# Patient Record
Sex: Female | Born: 1965 | Race: White | Hispanic: No | Marital: Married | Smoking: Never smoker
Health system: Southern US, Community
[De-identification: ages and names within clinical notes are randomized; demographics above are authoritative.]

## PROBLEM LIST (undated history)

## (undated) DIAGNOSIS — E119 Type 2 diabetes mellitus without complications: Secondary | ICD-10-CM

## (undated) DIAGNOSIS — D649 Anemia, unspecified: Secondary | ICD-10-CM

## (undated) DIAGNOSIS — D72829 Elevated white blood cell count, unspecified: Secondary | ICD-10-CM

## (undated) DIAGNOSIS — IMO0001 Reserved for inherently not codable concepts without codable children: Secondary | ICD-10-CM

## (undated) DIAGNOSIS — M1990 Unspecified osteoarthritis, unspecified site: Secondary | ICD-10-CM

## (undated) DIAGNOSIS — H269 Unspecified cataract: Secondary | ICD-10-CM

## (undated) DIAGNOSIS — K219 Gastro-esophageal reflux disease without esophagitis: Secondary | ICD-10-CM

## (undated) DIAGNOSIS — I10 Essential (primary) hypertension: Secondary | ICD-10-CM

## (undated) DIAGNOSIS — N189 Chronic kidney disease, unspecified: Secondary | ICD-10-CM

## (undated) DIAGNOSIS — K5900 Constipation, unspecified: Secondary | ICD-10-CM

## (undated) DIAGNOSIS — S92901A Unspecified fracture of right foot, initial encounter for closed fracture: Secondary | ICD-10-CM

## (undated) DIAGNOSIS — H9190 Unspecified hearing loss, unspecified ear: Secondary | ICD-10-CM

## (undated) DIAGNOSIS — E1140 Type 2 diabetes mellitus with diabetic neuropathy, unspecified: Secondary | ICD-10-CM

## (undated) DIAGNOSIS — K5790 Diverticulosis of intestine, part unspecified, without perforation or abscess without bleeding: Secondary | ICD-10-CM

## (undated) DIAGNOSIS — D509 Iron deficiency anemia, unspecified: Secondary | ICD-10-CM

## (undated) DIAGNOSIS — S069X9A Unspecified intracranial injury with loss of consciousness of unspecified duration, initial encounter: Secondary | ICD-10-CM

## (undated) DIAGNOSIS — J189 Pneumonia, unspecified organism: Secondary | ICD-10-CM

## (undated) DIAGNOSIS — E7800 Pure hypercholesterolemia, unspecified: Secondary | ICD-10-CM

## (undated) DIAGNOSIS — G629 Polyneuropathy, unspecified: Secondary | ICD-10-CM

## (undated) DIAGNOSIS — F329 Major depressive disorder, single episode, unspecified: Secondary | ICD-10-CM

## (undated) DIAGNOSIS — F32A Depression, unspecified: Secondary | ICD-10-CM

## (undated) HISTORY — DX: Anemia, unspecified: D64.9

## (undated) HISTORY — PX: DILATION AND CURETTAGE OF UTERUS: SHX78

## (undated) HISTORY — DX: Elevated white blood cell count, unspecified: D72.829

## (undated) HISTORY — PX: SKIN GRAFT: SHX250

## (undated) HISTORY — PX: TUBAL LIGATION: SHX77

## (undated) HISTORY — PX: EYE SURGERY: SHX253

## (undated) HISTORY — PX: ABDOMINAL HYSTERECTOMY: SHX81

## (undated) HISTORY — PX: DIAGNOSTIC LAPAROSCOPY: SUR761

## (undated) HISTORY — PX: OTHER SURGICAL HISTORY: SHX169

## (undated) HISTORY — PX: CHOLECYSTECTOMY: SHX55

## (undated) HISTORY — DX: Unspecified cataract: H26.9

## (undated) HISTORY — DX: Iron deficiency anemia, unspecified: D50.9

## (undated) HISTORY — PX: SHOULDER SURGERY: SHX246

---

## 1996-08-22 DIAGNOSIS — S069X1A Unspecified intracranial injury with loss of consciousness of 30 minutes or less, initial encounter: Secondary | ICD-10-CM

## 1996-08-22 DIAGNOSIS — S069X9A Unspecified intracranial injury with loss of consciousness of unspecified duration, initial encounter: Secondary | ICD-10-CM

## 1996-08-22 HISTORY — DX: Unspecified intracranial injury with loss of consciousness of 30 minutes or less, initial encounter: S06.9X1A

## 1996-08-22 HISTORY — DX: Unspecified intracranial injury with loss of consciousness of unspecified duration, initial encounter: S06.9X9A

## 2004-03-03 ENCOUNTER — Encounter (HOSPITAL_BASED_OUTPATIENT_CLINIC_OR_DEPARTMENT_OTHER): Admission: RE | Admit: 2004-03-03 | Discharge: 2004-04-02 | Payer: Self-pay | Admitting: Internal Medicine

## 2004-04-30 ENCOUNTER — Encounter (HOSPITAL_BASED_OUTPATIENT_CLINIC_OR_DEPARTMENT_OTHER): Admission: RE | Admit: 2004-04-30 | Discharge: 2004-05-18 | Payer: Self-pay | Admitting: Internal Medicine

## 2010-09-12 ENCOUNTER — Encounter: Payer: Self-pay | Admitting: Orthopedic Surgery

## 2015-06-25 ENCOUNTER — Other Ambulatory Visit (HOSPITAL_COMMUNITY): Payer: Self-pay | Admitting: Orthopedic Surgery

## 2015-06-30 ENCOUNTER — Encounter (HOSPITAL_COMMUNITY): Payer: Self-pay | Admitting: *Deleted

## 2015-07-01 ENCOUNTER — Observation Stay (HOSPITAL_COMMUNITY)
Admission: RE | Admit: 2015-07-01 | Discharge: 2015-07-01 | Disposition: A | Discharge status: Home / Self Care | Payer: Medicare Other | Source: Ambulatory Visit | Attending: Orthopedic Surgery | Admitting: Orthopedic Surgery

## 2015-07-01 ENCOUNTER — Encounter (HOSPITAL_COMMUNITY): Payer: Self-pay | Admitting: *Deleted

## 2015-07-01 ENCOUNTER — Ambulatory Visit (HOSPITAL_COMMUNITY): Payer: Medicare Other | Admitting: Anesthesiology

## 2015-07-01 ENCOUNTER — Encounter (HOSPITAL_COMMUNITY)
Admission: RE | Disposition: A | Discharge status: Home / Self Care | Payer: Self-pay | Source: Ambulatory Visit | Attending: Orthopedic Surgery

## 2015-07-01 DIAGNOSIS — E1122 Type 2 diabetes mellitus with diabetic chronic kidney disease: Secondary | ICD-10-CM | POA: Insufficient documentation

## 2015-07-01 DIAGNOSIS — N183 Chronic kidney disease, stage 3 (moderate): Secondary | ICD-10-CM | POA: Insufficient documentation

## 2015-07-01 DIAGNOSIS — E11610 Type 2 diabetes mellitus with diabetic neuropathic arthropathy: Principal | ICD-10-CM | POA: Insufficient documentation

## 2015-07-01 DIAGNOSIS — Z6841 Body Mass Index (BMI) 40.0 and over, adult: Secondary | ICD-10-CM | POA: Insufficient documentation

## 2015-07-01 DIAGNOSIS — Z7984 Long term (current) use of oral hypoglycemic drugs: Secondary | ICD-10-CM | POA: Insufficient documentation

## 2015-07-01 DIAGNOSIS — E1140 Type 2 diabetes mellitus with diabetic neuropathy, unspecified: Secondary | ICD-10-CM | POA: Insufficient documentation

## 2015-07-01 HISTORY — DX: Type 2 diabetes mellitus without complications: E11.9

## 2015-07-01 HISTORY — DX: Diverticulosis of intestine, part unspecified, without perforation or abscess without bleeding: K57.90

## 2015-07-01 HISTORY — DX: Reserved for inherently not codable concepts without codable children: IMO0001

## 2015-07-01 HISTORY — PX: OPEN REDUCTION INTERNAL FIXATION (ORIF) FOOT LISFRANC FRACTURE: SHX5990

## 2015-07-01 HISTORY — DX: Gastro-esophageal reflux disease without esophagitis: K21.9

## 2015-07-01 HISTORY — DX: Unspecified hearing loss, unspecified ear: H91.90

## 2015-07-01 HISTORY — DX: Pure hypercholesterolemia, unspecified: E78.00

## 2015-07-01 HISTORY — DX: Type 2 diabetes mellitus with diabetic neuropathy, unspecified: E11.40

## 2015-07-01 HISTORY — DX: Pneumonia, unspecified organism: J18.9

## 2015-07-01 HISTORY — DX: Major depressive disorder, single episode, unspecified: F32.9

## 2015-07-01 HISTORY — DX: Chronic kidney disease, unspecified: N18.9

## 2015-07-01 HISTORY — DX: Depression, unspecified: F32.A

## 2015-07-01 HISTORY — DX: Unspecified fracture of right foot, initial encounter for closed fracture: S92.901A

## 2015-07-01 LAB — CBC
HEMATOCRIT: 34.7 % — AB (ref 36.0–46.0)
Hemoglobin: 11.3 g/dL — ABNORMAL LOW (ref 12.0–15.0)
MCH: 29.4 pg (ref 26.0–34.0)
MCHC: 32.6 g/dL (ref 30.0–36.0)
MCV: 90.4 fL (ref 78.0–100.0)
PLATELETS: 373 10*3/uL (ref 150–400)
RBC: 3.84 MIL/uL — AB (ref 3.87–5.11)
RDW: 13.4 % (ref 11.5–15.5)
WBC: 11 10*3/uL — ABNORMAL HIGH (ref 4.0–10.5)

## 2015-07-01 LAB — COMPREHENSIVE METABOLIC PANEL
ALK PHOS: 82 U/L (ref 38–126)
ALT: 19 U/L (ref 14–54)
AST: 18 U/L (ref 15–41)
Albumin: 3 g/dL — ABNORMAL LOW (ref 3.5–5.0)
Anion gap: 9 (ref 5–15)
BILIRUBIN TOTAL: 0.2 mg/dL — AB (ref 0.3–1.2)
BUN: 28 mg/dL — AB (ref 6–20)
CALCIUM: 9.1 mg/dL (ref 8.9–10.3)
CO2: 25 mmol/L (ref 22–32)
CREATININE: 1.14 mg/dL — AB (ref 0.44–1.00)
Chloride: 103 mmol/L (ref 101–111)
GFR, EST NON AFRICAN AMERICAN: 55 mL/min — AB (ref >60)
Glucose, Bld: 224 mg/dL — ABNORMAL HIGH (ref 65–99)
Potassium: 4.3 mmol/L (ref 3.5–5.1)
Sodium: 137 mmol/L (ref 135–145)
TOTAL PROTEIN: 6.6 g/dL (ref 6.5–8.1)

## 2015-07-01 LAB — APTT: aPTT: 27 seconds (ref 24–37)

## 2015-07-01 LAB — GLUCOSE, CAPILLARY
GLUCOSE-CAPILLARY: 219 mg/dL — AB (ref 65–99)
Glucose-Capillary: 219 mg/dL — ABNORMAL HIGH (ref 65–99)
Glucose-Capillary: 174 mg/dL — ABNORMAL HIGH (ref 65–99)
Glucose-Capillary: 190 mg/dL — ABNORMAL HIGH (ref 65–99)

## 2015-07-01 LAB — PROTIME-INR
INR: 1.01 (ref 0.00–1.49)
PROTHROMBIN TIME: 13.5 s (ref 11.6–15.2)

## 2015-07-01 SURGERY — OPEN REDUCTION INTERNAL FIXATION (ORIF) FOOT LISFRANC FRACTURE
Anesthesia: General | Laterality: Right

## 2015-07-01 MED ORDER — FENTANYL CITRATE (PF) 250 MCG/5ML IJ SOLN
INTRAMUSCULAR | Status: AC
  Filled 2015-07-01: qty 5

## 2015-07-01 MED ORDER — LACTATED RINGERS IV SOLN: INTRAVENOUS | Status: DC | PRN

## 2015-07-01 MED ORDER — FENTANYL CITRATE (PF) 100 MCG/2ML IJ SOLN
INTRAMUSCULAR | Status: DC | PRN
  Administered 2015-07-01: 50 ug via INTRAVENOUS
  Administered 2015-07-01: 100 ug via INTRAVENOUS
  Administered 2015-07-01 (×2): 50 ug via INTRAVENOUS

## 2015-07-01 MED ORDER — ONDANSETRON HCL 4 MG/2ML IJ SOLN
INTRAMUSCULAR | Status: DC | PRN
  Administered 2015-07-01: 4 mg via INTRAVENOUS

## 2015-07-01 MED ORDER — LIDOCAINE HCL (CARDIAC) 20 MG/ML IV SOLN
INTRAVENOUS | Status: DC | PRN
  Administered 2015-07-01: 80 mg via INTRAVENOUS

## 2015-07-01 MED ORDER — METHOCARBAMOL 500 MG PO TABS
500.0000 mg | ORAL_TABLET | Freq: Four times a day (QID) | ORAL | Status: DC | PRN
Start: 2015-07-01 — End: 2015-07-01

## 2015-07-01 MED ORDER — SODIUM CHLORIDE 0.9 % IV SOLN: INTRAVENOUS | Status: DC

## 2015-07-01 MED ORDER — INSULIN ASPART 100 UNIT/ML ~~LOC~~ SOLN
SUBCUTANEOUS | Status: AC
  Filled 2015-07-01: qty 3

## 2015-07-01 MED ORDER — PROPOFOL 10 MG/ML IV BOLUS
INTRAVENOUS | Status: DC | PRN
  Administered 2015-07-01: 50 mg via INTRAVENOUS
  Administered 2015-07-01: 200 mg via INTRAVENOUS

## 2015-07-01 MED ORDER — MIDAZOLAM HCL 5 MG/5ML IJ SOLN
INTRAMUSCULAR | Status: DC | PRN
  Administered 2015-07-01: 2 mg via INTRAVENOUS

## 2015-07-01 MED ORDER — OXYCODONE HCL 5 MG PO TABS: 5.0000 mg | ORAL_TABLET | ORAL | Status: DC | PRN

## 2015-07-01 MED ORDER — CEFAZOLIN SODIUM 10 G IJ SOLR
3.0000 g | INTRAMUSCULAR | Status: DC
  Filled 2015-07-01 (×2): qty 3000

## 2015-07-01 MED ORDER — MIDAZOLAM HCL 2 MG/2ML IJ SOLN
INTRAMUSCULAR | Status: AC
  Administered 2015-07-01: 2 mg via INTRAVENOUS
  Filled 2015-07-01: qty 2

## 2015-07-01 MED ORDER — METHOCARBAMOL 1000 MG/10ML IJ SOLN: 500.0000 mg | Freq: Four times a day (QID) | INTRAVENOUS | Status: DC | PRN

## 2015-07-01 MED ORDER — PHENYLEPHRINE HCL 10 MG/ML IJ SOLN
INTRAMUSCULAR | Status: DC | PRN
  Administered 2015-07-01 (×3): 80 ug via INTRAVENOUS

## 2015-07-01 MED ORDER — DIPHENHYDRAMINE HCL 50 MG/ML IJ SOLN
INTRAMUSCULAR | Status: DC | PRN
  Administered 2015-07-01: 25 mg via INTRAVENOUS

## 2015-07-01 MED ORDER — PROMETHAZINE HCL 25 MG/ML IJ SOLN: 6.2500 mg | INTRAMUSCULAR | Status: DC | PRN

## 2015-07-01 MED ORDER — FENTANYL CITRATE (PF) 100 MCG/2ML IJ SOLN: 100.0000 ug | Freq: Once | INTRAMUSCULAR | Status: DC

## 2015-07-01 MED ORDER — BUPIVACAINE-EPINEPHRINE (PF) 0.5% -1:200000 IJ SOLN
INTRAMUSCULAR | Status: DC | PRN
  Administered 2015-07-01: 40 mL via PERINEURAL

## 2015-07-01 MED ORDER — PROPOFOL 10 MG/ML IV BOLUS
INTRAVENOUS | Status: AC
  Filled 2015-07-01: qty 20

## 2015-07-01 MED ORDER — FENTANYL CITRATE (PF) 100 MCG/2ML IJ SOLN: 25.0000 ug | INTRAMUSCULAR | Status: DC | PRN

## 2015-07-01 MED ORDER — MIDAZOLAM HCL 2 MG/2ML IJ SOLN
INTRAMUSCULAR | Status: AC
  Filled 2015-07-01: qty 4

## 2015-07-01 MED ORDER — MIDAZOLAM HCL 2 MG/2ML IJ SOLN
2.0000 mg | Freq: Once | INTRAMUSCULAR | Status: AC
  Administered 2015-07-01: 2 mg via INTRAVENOUS

## 2015-07-01 MED ORDER — 0.9 % SODIUM CHLORIDE (POUR BTL) OPTIME
TOPICAL | Status: DC | PRN
  Administered 2015-07-01: 1000 mL

## 2015-07-01 MED ORDER — INSULIN ASPART 100 UNIT/ML ~~LOC~~ SOLN
0.0000 [IU] | Freq: Three times a day (TID) | SUBCUTANEOUS | Status: DC
  Administered 2015-07-01: 3 [IU] via SUBCUTANEOUS

## 2015-07-01 MED ORDER — DEXAMETHASONE SODIUM PHOSPHATE 4 MG/ML IJ SOLN
INTRAMUSCULAR | Status: DC | PRN
  Administered 2015-07-01: 4 mg via INTRAVENOUS

## 2015-07-01 MED ORDER — SODIUM CHLORIDE 0.9 % IV SOLN
INTRAVENOUS | Status: DC
  Administered 2015-07-01: 09:00:00 via INTRAVENOUS

## 2015-07-01 MED ORDER — INSULIN ASPART 100 UNIT/ML ~~LOC~~ SOLN: 3.0000 [IU] | Freq: Once | SUBCUTANEOUS | Status: DC

## 2015-07-01 MED ORDER — LACTATED RINGERS IV SOLN
INTRAVENOUS | Status: DC | PRN
  Administered 2015-07-01: 11:00:00 via INTRAVENOUS

## 2015-07-01 MED ORDER — CHLORHEXIDINE GLUCONATE 4 % EX LIQD: 60.0000 mL | Freq: Once | CUTANEOUS | Status: DC

## 2015-07-01 MED ORDER — FENTANYL CITRATE (PF) 100 MCG/2ML IJ SOLN
INTRAMUSCULAR | Status: AC
  Filled 2015-07-01: qty 2

## 2015-07-01 MED ORDER — SUCCINYLCHOLINE CHLORIDE 20 MG/ML IJ SOLN
INTRAMUSCULAR | Status: DC | PRN
  Administered 2015-07-01: 140 mg via INTRAVENOUS

## 2015-07-01 SURGICAL SUPPLY — 46 items
BANDAGE ESMARK 6X9 LF (GAUZE/BANDAGES/DRESSINGS) IMPLANT
BIT DRILL CANN 3.2 (BIT) ×1 IMPLANT
BNDG CMPR 9X6 STRL LF SNTH (GAUZE/BANDAGES/DRESSINGS)
BNDG COHESIVE 4X5 TAN STRL (GAUZE/BANDAGES/DRESSINGS) ×3 IMPLANT
BNDG ESMARK 6X9 LF (GAUZE/BANDAGES/DRESSINGS)
BNDG GAUZE ELAST 4 BULKY (GAUZE/BANDAGES/DRESSINGS) ×3 IMPLANT
COVER SURGICAL LIGHT HANDLE (MISCELLANEOUS) ×6 IMPLANT
CUFF TOURNIQUET SINGLE 34IN LL (TOURNIQUET CUFF) IMPLANT
CUFF TOURNIQUET SINGLE 44IN (TOURNIQUET CUFF) IMPLANT
DRAPE INCISE IOBAN 66X45 STRL (DRAPES) ×3 IMPLANT
DRAPE OEC MINIVIEW 54X84 (DRAPES) IMPLANT
DRAPE PROXIMA HALF (DRAPES) ×3 IMPLANT
DRAPE U-SHAPE 47X51 STRL (DRAPES) ×3 IMPLANT
DRILL BIT CANN 3.2 (BIT) ×3
DRSG ADAPTIC 3X8 NADH LF (GAUZE/BANDAGES/DRESSINGS) ×3 IMPLANT
DRSG PAD ABDOMINAL 8X10 ST (GAUZE/BANDAGES/DRESSINGS) ×3 IMPLANT
DURAPREP 26ML APPLICATOR (WOUND CARE) ×3 IMPLANT
ELECT REM PT RETURN 9FT ADLT (ELECTROSURGICAL) ×3
ELECTRODE REM PT RTRN 9FT ADLT (ELECTROSURGICAL) ×1 IMPLANT
GAUZE SPONGE 4X4 12PLY STRL (GAUZE/BANDAGES/DRESSINGS) ×3 IMPLANT
GLOVE BIOGEL PI IND STRL 9 (GLOVE) ×1 IMPLANT
GLOVE BIOGEL PI INDICATOR 9 (GLOVE) ×2
GLOVE SURG ORTHO 9.0 STRL STRW (GLOVE) ×3 IMPLANT
GOWN STRL REUS W/ TWL XL LVL3 (GOWN DISPOSABLE) ×3 IMPLANT
GOWN STRL REUS W/TWL XL LVL3 (GOWN DISPOSABLE) ×9
GUIDEWIRE NON THREAD 1.6MM (WIRE) ×3 IMPLANT
GUIDEWIRE THREADED 2.8 (WIRE) ×3 IMPLANT
KIT BASIN OR (CUSTOM PROCEDURE TRAY) ×3 IMPLANT
KIT ROOM TURNOVER OR (KITS) ×3 IMPLANT
MANIFOLD NEPTUNE II (INSTRUMENTS) ×3 IMPLANT
NS IRRIG 1000ML POUR BTL (IV SOLUTION) ×3 IMPLANT
PACK ORTHO EXTREMITY (CUSTOM PROCEDURE TRAY) ×3 IMPLANT
PAD ARMBOARD 7.5X6 YLW CONV (MISCELLANEOUS) ×6 IMPLANT
SCREW CANC 32T 6.5 100 (Screw) ×3 IMPLANT
SCREW CANN 32 THRD/100 7.3 (Screw) ×3 IMPLANT
SCREW COMPRESSION 4.5X60 (Screw) ×3 IMPLANT
SCREW HEADLESS COMP 4.5X50 (Screw) ×3 IMPLANT
SPONGE LAP 18X18 X RAY DECT (DISPOSABLE) ×3 IMPLANT
STAPLER VISISTAT 35W (STAPLE) IMPLANT
SUCTION FRAZIER TIP 10 FR DISP (SUCTIONS) ×3 IMPLANT
SUT ETHILON 2 0 PSLX (SUTURE) IMPLANT
SUT VIC AB 2-0 CTB1 (SUTURE) ×6 IMPLANT
TOWEL OR 17X24 6PK STRL BLUE (TOWEL DISPOSABLE) ×3 IMPLANT
TOWEL OR 17X26 10 PK STRL BLUE (TOWEL DISPOSABLE) ×3 IMPLANT
TUBE CONNECTING 12'X1/4 (SUCTIONS) ×1
TUBE CONNECTING 12X1/4 (SUCTIONS) ×2 IMPLANT

## 2015-07-01 NOTE — H&P (Signed)
Katherine Peterson is an 49 y.o. female.   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  Past Medical History  Diagnosis Date  . Diabetes mellitus without complication ([place])   . Hypercholesterolemia   . GERD (gastroesophageal reflux disease)   . Foot fracture, right     dislocation  . Chronic kidney disease     stage three  . Diverticulosis   . Pneumonia   . Depression   . Diabetic neuropathy ([place])   . 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 Cholecystectomy    . Amputation left foot    . Abdominal hysterectomy    . Skin graft      left  . Shoulder surgery    . Diagnostic laparoscopy      small portion of colon  . Eye surgery    . Tubal ligation    . Dilation and curettage of uterus      Family History  Problem Relation Age of Onset  . Transient ischemic attack Mother   . Diabetes Mother   . Heart disease Father   . Cancer Sister    Social History:  reports that she has never smoked. She has never used smokeless tobacco. She reports that she does not drink alcohol or use illicit drugs.  Allergies:  Allergies  Allergen Reactions  . Codeine Hives  . Keflex [Cephalexin] Other (See Comments)    Sick to stomach    No prescriptions prior to admission    No results found for this or any previous visit (from the past 58 hour(s)). No results found.  Review of Systems  All other systems reviewed and are negative.   There were no vitals taken for this visit. Physical Exam  On examination patient has a palpable pulse. She has a deformity with rocker bottom collapse through the Lisfranc joint. Radiograph shows complete dislocation across the Lisfranc complex. Assessment/Plan Assessment: Diabetic insensate neuropathy with Charcot  collapse across the Lisfranc joint with acute rocker-bottom deformity.  Plan: We will plan for open reduction internal fixation with fusion along the medial column. Risks and benefits of surgery were discussed including loss reduction need for additional surgery. Patient states she understands wish to proceed at this time.  Kealan Buchan V 07/01/2015, 6:34 AM

## 2015-07-01 NOTE — Discharge Instructions (Signed)
No weight to right leg. Elevate foot above heart Stop by office for prescriptions

## 2015-07-01 NOTE — Anesthesia Postprocedure Evaluation (Signed)
  Anesthesia Post-op Note  Patient: Katherine Peterson  Procedure(s) Performed: Procedure(s) (LRB): OPEN REDUCTION INTERNAL FIXATION (ORIF) RIGHT FOOT LISFRANC FRACTURE/DISLOCATION (Right)  Patient Location: PACU  Anesthesia Type: GA combined with regional for post-op pain  Level of Consciousness: awake and alert   Airway and Oxygen Therapy: Patient Spontanous Breathing  Post-op Pain: mild  Post-op Assessment: Post-op Vital signs reviewed, Patient's Cardiovascular Status Stable, Respiratory Function Stable, Patent Airway and No signs of Nausea or vomiting  Last Vitals:  Filed Vitals:   07/01/15 1315  BP:   Pulse: 78  Temp:   Resp: 14    Post-op Vital Signs: stable   Complications: No apparent anesthesia complications

## 2015-07-01 NOTE — Progress Notes (Signed)
Orthopedic Tech Progress Note Patient Details:  Katherine Peterson [birth year] [id number]  Ortho Devices Type of Ortho Device: CAM walker Ortho Device/Splint Location: rle Ortho Device/Splint Interventions: Application   [name] 07/01/2015, 1:12 PM

## 2015-07-01 NOTE — Transfer of Care (Signed)
Immediate Anesthesia Transfer of Care Note  Patient: Katherine Peterson  Procedure(s) Performed: Procedure(s): OPEN REDUCTION INTERNAL FIXATION (ORIF) RIGHT FOOT LISFRANC FRACTURE/DISLOCATION (Right)  Patient Location: PACU  Anesthesia Type:General and Regional  Level of Consciousness: awake, alert  and oriented  Airway & Oxygen Therapy: Patient Spontanous Breathing and Patient connected to nasal cannula oxygen  Post-op Assessment: Report given to RN, Post -op Vital signs reviewed and stable and Patient moving all extremities X 4  Post vital signs: Reviewed and stable  Last Vitals:  Filed Vitals:   07/01/15 0945  BP: 174/55  Pulse: 72  Temp:   Resp: 15    Complications: No apparent anesthesia complications

## 2015-07-01 NOTE — Anesthesia Preprocedure Evaluation (Addendum)
Anesthesia Evaluation  Patient identified by MRN, date of birth, ID band Patient awake    Reviewed: Allergy & Precautions, NPO status , Patient's Chart, lab work & pertinent test results, reviewed documented beta blocker date and time   Airway Mallampati: III  TM Distance: >3 FB Neck ROM: Full    Dental  (+) Teeth Intact, Dental Advisory Given   Pulmonary neg pulmonary ROS,    Pulmonary exam normal breath sounds clear to auscultation       Cardiovascular hypertension, Pt. on medications and Pt. on home beta blockers (-) angina(-) Past MI Normal cardiovascular exam Rhythm:Regular Rate:Normal     Neuro/Psych PSYCHIATRIC DISORDERS Depression negative neurological ROS     GI/Hepatic Neg liver ROS, GERD  Medicated,  Endo/Other  diabetes (Diabetic neuropathy), Type 2, Oral Hypoglycemic Agents, Insulin DependentMorbid obesity  Renal/GU Renal InsufficiencyRenal disease     Musculoskeletal negative musculoskeletal ROS (+)   Abdominal   Peds  Hematology  (+) Blood dyscrasia, anemia ,   Anesthesia Other Findings Day of surgery medications reviewed with the patient.  Reproductive/Obstetrics                          Anesthesia Physical Anesthesia Plan  ASA: III  Anesthesia Plan: General   Post-op Pain Management: GA combined w/ Regional for post-op pain   Induction: Intravenous  Airway Management Planned: LMA  Additional Equipment:   Intra-op Plan:   Post-operative Plan: Extubation in OR  Informed Consent: I have reviewed the patients History and Physical, chart, labs and discussed the procedure including the risks, benefits and alternatives for the proposed anesthesia with the patient or authorized representative who has indicated his/her understanding and acceptance.   Dental advisory given  Plan Discussed with: CRNA  Anesthesia Plan Comments: (Risks/benefits of general anesthesia  discussed with patient including risk of damage to teeth, lips, gum, and tongue, nausea/vomiting, allergic reactions to medications, and the possibility of heart attack, stroke and death.  All patient questions answered.  Patient wishes to proceed.  GA+Popliteal/Saphenous nerve block.)       Anesthesia Quick Evaluation

## 2015-07-01 NOTE — Anesthesia Procedure Notes (Addendum)
Anesthesia Regional Block:  Adductor canal block  Pre-Anesthetic Checklist: ,, timeout performed, Correct Patient, Correct Site, Correct Laterality, Correct Procedure, Correct Position, site marked, Risks and benefits discussed,  Surgical consent,  Pre-op evaluation,  At surgeon's request and post-op pain management  Laterality: Right  Prep: chloraprep       Needles:  Injection technique: Single-shot  Needle Type: Echogenic Needle     Needle Length: 9cm 9 cm Needle Gauge: 21 and 21 G    Additional Needles:  Procedures: ultrasound guided (picture in chart) Adductor canal block Narrative:  Injection made incrementally with aspirations every 5 mL.  Performed by: Personally  Anesthesiologist: Catalina Gravel  Additional Notes: No pain on injection. No increased resistance to injection. Injection made in 5cc increments.  Good needle visualization.  Patient tolerated procedure well.   Anesthesia Regional Block:  Popliteal block  Pre-Anesthetic Checklist: ,, timeout performed, Correct Patient, Correct Site, Correct Laterality, Correct Procedure, Correct Position, site marked, Risks and benefits discussed,  Surgical consent,  Pre-op evaluation,  At surgeon's request and post-op pain management  Laterality: Right  Prep: chloraprep       Needles:  Injection technique: Single-shot  Needle Type: Echogenic Needle     Needle Length: 9cm 9 cm Needle Gauge: 21 and 21 G    Additional Needles:  Procedures: ultrasound guided (picture in chart) Popliteal block Narrative:  Injection made incrementally with aspirations every 5 mL.  Performed by: Personally  Anesthesiologist: Catalina Gravel  Additional Notes: No pain on injection. No increased resistance to injection. Injection made in 5cc increments.  Good needle visualization.  Patient tolerated procedure well.   Procedure Name: Intubation Date/Time: 07/01/2015 10:52 AM Performed by: Mariea Clonts Pre-anesthesia Checklist: Patient identified, Timeout performed, Emergency Drugs available, Suction available and Patient being monitored Patient Re-evaluated:Patient Re-evaluated prior to inductionOxygen Delivery Method: Circle system utilized Preoxygenation: Pre-oxygenation with 100% oxygen Intubation Type: IV induction Laryngoscope Size: Miller and 2 Grade View: Grade II Tube type: Oral Tube size: 7.5 mm Number of attempts: 1 Placement Confirmation: ETT inserted through vocal cords under direct vision,  positive ETCO2 and breath sounds checked- equal and bilateral Tube secured with: Tape Dental Injury: Teeth and Oropharynx as per pre-operative assessment     Procedure Name: LMA Insertion Date/Time: 07/01/2015 10:43 AM Performed by: Tamala Fothergill S Intubation Type: IV induction LMA: LMA inserted and LMA with gastric port inserted LMA Size: 3.0 Number of attempts: 1 Placement Confirmation: positive ETCO2 and breath sounds checked- equal and bilateral Tube secured with: Tape Dental Injury: Teeth and Oropharynx as per pre-operative assessment

## 2015-07-01 NOTE — Op Note (Signed)
07/01/2015  12:20 PM  PATIENT:  Katherine Peterson    PRE-OPERATIVE DIAGNOSIS:  Charcot Collapse Right Foot  POST-OPERATIVE DIAGNOSIS:  Same  PROCEDURE:  OPEN REDUCTION INTERNAL FIXATION (ORIF) RIGHT FOOT LISFRANC FRACTURE/DISLOCATION  SURGEON:  Newt Minion, MD  PHYSICIAN ASSISTANT:None ANESTHESIA:   General  PREOPERATIVE INDICATIONS:  [name] is a  49 y.o. female with a diagnosis of Charcot Collapse Right Foot who failed conservative measures and elected for surgical management.    The risks benefits and alternatives were discussed with the patient preoperatively including but not limited to the risks of infection, bleeding, nerve injury, cardiopulmonary complications, the need for revision surgery, among others, and the patient was willing to proceed.  OPERATIVE IMPLANTS: Synthes 7.3 and 4.5 cannulated screws  OPERATIVE FINDINGS: Hard sclerotic bone with complete destruction of ligamentous complex of the midfoot. Complete avulsion of the anterior tibial tendon.  OPERATIVE PROCEDURE: Patient brought to operating room after undergoing a popliteal block. After adequate levels of anesthesia were obtained patient's right lower extremity was prepped using DuraPrep draped into a sterile field. A timeout was called. A dorsal incision was made over the first webspace. This was carried down to the base of the first metatarsal. Identification of the medial cuneiform showed complete avulsion of the anterior tibial tendon the bone was hard and sclerotic. The bone edges were debrided of articular cartilage the medial column was realigned and a guidewire was inserted retrograde I the first metatarsal at the first metatarsal head a skin incision was made. This pin was then driven antegrade through the medial cuneiform navicular into the talar neck. C-arm floss be verified alignment. A solid screw was first attempted to be placed but this did not provide the proper guidance with the K wire we then used a 7.3  cannulated screw to stabilize the medial column. Further debridement was carried out through the midfoot and a lateral incision was made to further debride the base of the fifth metatarsal and fourth metatarsal. This allowed for some reduction of the lateral column of the Lisfranc joint. A separate screw was then placed from the medial cuneiform into the base of the second metatarsal as well as a screw transversely from the first to the fifth metatarsal. C-arm fluoroscopy verified alignment. The wounds were irrigated with normal saline. A tourniquet was not used. The incisions were closed using 2-0 nylon. A sterile compressive dressing was applied. Patient was extubated taken to the PACU in stable condition.

## 2015-07-02 ENCOUNTER — Encounter (HOSPITAL_COMMUNITY): Payer: Self-pay | Admitting: Orthopedic Surgery

## 2015-07-02 NOTE — Discharge Summary (Signed)
Physician Discharge Summary  Patient ID: Katherine Peterson MRN: [id number] DOB/AGE: 51-Aug-1967 51 y.o.  Admit date: 07/01/2015 Discharge date: 07/02/2015  Admission Diagnoses: Charcot collapse secondary to diabetes  Discharge Diagnoses:  Active Problems:   Charcot foot due to diabetes mellitus [facility])   Discharged Condition: stable  Hospital Course: Patient's hospital course essentially unremarkable she underwent surgery was discharged to home postoperatively.  Consults: None  Significant Diagnostic Studies: labs: Routine labs  Treatments: surgery: See operative note  Discharge Exam: Blood pressure 158/69, pulse 72, temperature 97.7 F (36.5 C), temperature source Oral, resp. rate 16, height 5\' 7"  (1.702 m), weight 122.471 kg (270 lb), SpO2 94 %. Incision/Wound: dressing clean and dry  Disposition: 01-Home or Self Care     Medication List    ASK your doctor about these medications        acetaminophen 500 MG tablet  Commonly known as:  TYLENOL  Take 1,000 mg by mouth every 6 (six) hours as needed for moderate pain.     amoxicillin-clavulanate 875-125 MG tablet  Commonly known as:  AUGMENTIN  Take 1 tablet by mouth 2 (two) times daily. For 10 days     aspirin EC 81 MG tablet  Take 81 mg by mouth daily.     clonazePAM 0.5 MG tablet  Commonly known as:  KLONOPIN  Take 0.5 mg by mouth daily as needed for anxiety.     insulin NPH Human 100 UNIT/ML injection  Commonly known as:  HUMULIN N,NOVOLIN N  Inject 50-80 Units into the skin 2 (two) times daily. Inject 50 units subque in the morning and 80 units subque in the evening     losartan-hydrochlorothiazide 100-25 MG tablet  Commonly known as:  HYZAAR  Take 1 tablet by mouth daily.     metFORMIN 1000 MG tablet  Commonly known as:  GLUCOPHAGE  Take 1,000 mg by mouth 1 day or 1 dose.     metoprolol succinate 50 MG 24 hr tablet  Commonly known as:  TOPROL-XL  Take 50 mg by mouth daily. Take with or immediately following  a meal.     omeprazole 20 MG tablet  Commonly known as:  PRILOSEC OTC  Take 20 mg by mouth daily.     pravastatin 40 MG tablet  Commonly known as:  PRAVACHOL  Take 40 mg by mouth daily.           Follow-up Information    Follow up with DUDA,MARCUS V, MD In 1 week.   Specialty:  Orthopedic Surgery   Contact information:   [place] [address] [phone]       Signed: Newt Minion 07/02/2015, 6:42 AM

## 2015-10-23 DIAGNOSIS — S93323S Subluxation of tarsometatarsal joint of unspecified foot, sequela: Secondary | ICD-10-CM | POA: Diagnosis not present

## 2015-10-23 DIAGNOSIS — M25571 Pain in right ankle and joints of right foot: Secondary | ICD-10-CM | POA: Diagnosis not present

## 2015-10-23 DIAGNOSIS — E1142 Type 2 diabetes mellitus with diabetic polyneuropathy: Secondary | ICD-10-CM | POA: Diagnosis not present

## 2015-10-23 DIAGNOSIS — E11610 Type 2 diabetes mellitus with diabetic neuropathic arthropathy: Secondary | ICD-10-CM | POA: Diagnosis not present

## 2015-10-27 ENCOUNTER — Other Ambulatory Visit: Payer: Self-pay | Admitting: Orthopedic Surgery

## 2015-10-27 ENCOUNTER — Encounter (HOSPITAL_COMMUNITY): Payer: Self-pay | Admitting: *Deleted

## 2015-10-27 NOTE — Progress Notes (Addendum)
Mrs Gunderson reported that last A1C was 9.0, drawn 05/2015.  Patients PCP, Dr Earline Mayotte in [place] follows her diabetes.  Mrs Bolinsky reported that her evening NPH Insulin dose is reduced depending on what she eats, patient states that based on her planned evening meal that she will be taking 60 units of NPH. I instructed her to take 70% which is 42 units NPH and 25 units of NPH in am.  I instructed patient to check CBG to check CBG and if it is less than 70 to treat it with Glucose Gel, Glucose tablets or 1/2 cup of clear juice like apple juice or cranberry juice, or 1/2 cup of regular soda. (not cream soda). I instructed patient to recheck CBG in 15 minutes and if CBG is not greater than 70, to  Call [phone] (pre- op). If it is before pre-op opens to retreat as before and recheck CBG in 15 minutes. I told patient to make note of time that liquid is taken and amount, that surgical time may have to be adjusted.

## 2015-10-28 ENCOUNTER — Encounter (HOSPITAL_COMMUNITY): Payer: Self-pay | Admitting: Surgery

## 2015-10-28 ENCOUNTER — Ambulatory Visit (HOSPITAL_COMMUNITY)
Admission: RE | Admit: 2015-10-28 | Discharge: 2015-10-28 | Disposition: A | Discharge status: Home / Self Care | Payer: Medicare Other | Source: Ambulatory Visit | Attending: Orthopedic Surgery | Admitting: Orthopedic Surgery

## 2015-10-28 ENCOUNTER — Encounter (HOSPITAL_COMMUNITY)
Admission: RE | Disposition: A | Discharge status: Home / Self Care | Payer: Self-pay | Source: Ambulatory Visit | Attending: Orthopedic Surgery

## 2015-10-28 ENCOUNTER — Ambulatory Visit (HOSPITAL_COMMUNITY): Payer: Medicare Other | Admitting: Anesthesiology

## 2015-10-28 DIAGNOSIS — N183 Chronic kidney disease, stage 3 (moderate): Secondary | ICD-10-CM | POA: Diagnosis not present

## 2015-10-28 DIAGNOSIS — A5216 Charcot's arthropathy (tabetic): Secondary | ICD-10-CM | POA: Diagnosis not present

## 2015-10-28 DIAGNOSIS — M1990 Unspecified osteoarthritis, unspecified site: Secondary | ICD-10-CM | POA: Diagnosis not present

## 2015-10-28 DIAGNOSIS — L97519 Non-pressure chronic ulcer of other part of right foot with unspecified severity: Secondary | ICD-10-CM | POA: Diagnosis not present

## 2015-10-28 DIAGNOSIS — E7800 Pure hypercholesterolemia, unspecified: Secondary | ICD-10-CM | POA: Diagnosis not present

## 2015-10-28 DIAGNOSIS — T8132XA Disruption of internal operation (surgical) wound, not elsewhere classified, initial encounter: Secondary | ICD-10-CM | POA: Diagnosis not present

## 2015-10-28 DIAGNOSIS — Z6841 Body Mass Index (BMI) 40.0 and over, adult: Secondary | ICD-10-CM | POA: Insufficient documentation

## 2015-10-28 DIAGNOSIS — T8131XA Disruption of external operation (surgical) wound, not elsewhere classified, initial encounter: Secondary | ICD-10-CM | POA: Insufficient documentation

## 2015-10-28 DIAGNOSIS — E1140 Type 2 diabetes mellitus with diabetic neuropathy, unspecified: Secondary | ICD-10-CM | POA: Insufficient documentation

## 2015-10-28 DIAGNOSIS — Z881 Allergy status to other antibiotic agents status: Secondary | ICD-10-CM | POA: Insufficient documentation

## 2015-10-28 DIAGNOSIS — Z89432 Acquired absence of left foot: Secondary | ICD-10-CM | POA: Diagnosis not present

## 2015-10-28 DIAGNOSIS — E11621 Type 2 diabetes mellitus with foot ulcer: Secondary | ICD-10-CM | POA: Insufficient documentation

## 2015-10-28 DIAGNOSIS — Z885 Allergy status to narcotic agent status: Secondary | ICD-10-CM | POA: Insufficient documentation

## 2015-10-28 DIAGNOSIS — F329 Major depressive disorder, single episode, unspecified: Secondary | ICD-10-CM | POA: Insufficient documentation

## 2015-10-28 DIAGNOSIS — E1142 Type 2 diabetes mellitus with diabetic polyneuropathy: Secondary | ICD-10-CM | POA: Diagnosis not present

## 2015-10-28 DIAGNOSIS — S93323S Subluxation of tarsometatarsal joint of unspecified foot, sequela: Secondary | ICD-10-CM | POA: Diagnosis not present

## 2015-10-28 DIAGNOSIS — E11610 Type 2 diabetes mellitus with diabetic neuropathic arthropathy: Secondary | ICD-10-CM | POA: Diagnosis not present

## 2015-10-28 DIAGNOSIS — Y838 Other surgical procedures as the cause of abnormal reaction of the patient, or of later complication, without mention of misadventure at the time of the procedure: Secondary | ICD-10-CM | POA: Insufficient documentation

## 2015-10-28 DIAGNOSIS — E1122 Type 2 diabetes mellitus with diabetic chronic kidney disease: Secondary | ICD-10-CM | POA: Diagnosis not present

## 2015-10-28 DIAGNOSIS — I129 Hypertensive chronic kidney disease with stage 1 through stage 4 chronic kidney disease, or unspecified chronic kidney disease: Secondary | ICD-10-CM | POA: Insufficient documentation

## 2015-10-28 DIAGNOSIS — K219 Gastro-esophageal reflux disease without esophagitis: Secondary | ICD-10-CM | POA: Diagnosis not present

## 2015-10-28 DIAGNOSIS — N189 Chronic kidney disease, unspecified: Secondary | ICD-10-CM | POA: Diagnosis not present

## 2015-10-28 HISTORY — DX: Unspecified hearing loss, unspecified ear: H91.90

## 2015-10-28 HISTORY — PX: I & D EXTREMITY: SHX5045

## 2015-10-28 HISTORY — DX: Unspecified intracranial injury with loss of consciousness of unspecified duration, initial encounter: S06.9X9A

## 2015-10-28 HISTORY — DX: Constipation, unspecified: K59.00

## 2015-10-28 HISTORY — DX: Essential (primary) hypertension: I10

## 2015-10-28 HISTORY — DX: Polyneuropathy, unspecified: G62.9

## 2015-10-28 HISTORY — DX: Unspecified osteoarthritis, unspecified site: M19.90

## 2015-10-28 HISTORY — DX: Reserved for inherently not codable concepts without codable children: IMO0001

## 2015-10-28 LAB — COMPREHENSIVE METABOLIC PANEL
ALBUMIN: 3.2 g/dL — AB (ref 3.5–5.0)
ALK PHOS: 94 U/L (ref 38–126)
ALT: 16 U/L (ref 14–54)
ANION GAP: 13 (ref 5–15)
AST: 18 U/L (ref 15–41)
BILIRUBIN TOTAL: 0.5 mg/dL (ref 0.3–1.2)
BUN: 18 mg/dL (ref 6–20)
CALCIUM: 9.5 mg/dL (ref 8.9–10.3)
CO2: 24 mmol/L (ref 22–32)
CREATININE: 0.97 mg/dL (ref 0.44–1.00)
Chloride: 101 mmol/L (ref 101–111)
GFR calc Af Amer: >60 mL/min (ref >60)
GFR calc non Af Amer: >60 mL/min (ref >60)
GLUCOSE: 117 mg/dL — AB (ref 65–99)
Potassium: 3.8 mmol/L (ref 3.5–5.1)
Sodium: 138 mmol/L (ref 135–145)
TOTAL PROTEIN: 7.2 g/dL (ref 6.5–8.1)

## 2015-10-28 LAB — CBC
HEMATOCRIT: 37.3 % (ref 36.0–46.0)
HEMOGLOBIN: 12.1 g/dL (ref 12.0–15.0)
MCH: 28.5 pg (ref 26.0–34.0)
MCHC: 32.4 g/dL (ref 30.0–36.0)
MCV: 88 fL (ref 78.0–100.0)
Platelets: 395 10*3/uL (ref 150–400)
RBC: 4.24 MIL/uL (ref 3.87–5.11)
RDW: 14.4 % (ref 11.5–15.5)
WBC: 8.1 10*3/uL (ref 4.0–10.5)

## 2015-10-28 LAB — PROTIME-INR
INR: 1 (ref 0.00–1.49)
Prothrombin Time: 13.4 seconds (ref 11.6–15.2)

## 2015-10-28 LAB — APTT: APTT: 31 s (ref 24–37)

## 2015-10-28 LAB — GLUCOSE, CAPILLARY
Glucose-Capillary: 104 mg/dL — ABNORMAL HIGH (ref 65–99)
Glucose-Capillary: 111 mg/dL — ABNORMAL HIGH (ref 65–99)

## 2015-10-28 SURGERY — IRRIGATION AND DEBRIDEMENT EXTREMITY
Anesthesia: General | Site: Foot | Laterality: Right

## 2015-10-28 MED ORDER — SUCCINYLCHOLINE CHLORIDE 20 MG/ML IJ SOLN
INTRAMUSCULAR | Status: DC | PRN
  Administered 2015-10-28: 100 mg via INTRAVENOUS

## 2015-10-28 MED ORDER — HYDROMORPHONE HCL 1 MG/ML IJ SOLN: 0.2500 mg | INTRAMUSCULAR | Status: DC | PRN

## 2015-10-28 MED ORDER — MIDAZOLAM HCL 2 MG/2ML IJ SOLN
INTRAMUSCULAR | Status: AC
  Filled 2015-10-28: qty 2

## 2015-10-28 MED ORDER — PROPOFOL 10 MG/ML IV BOLUS
INTRAVENOUS | Status: AC
  Filled 2015-10-28: qty 20

## 2015-10-28 MED ORDER — ONDANSETRON HCL 4 MG/2ML IJ SOLN
INTRAMUSCULAR | Status: AC
  Filled 2015-10-28: qty 2

## 2015-10-28 MED ORDER — EPHEDRINE SULFATE 50 MG/ML IJ SOLN
INTRAMUSCULAR | Status: DC | PRN
  Administered 2015-10-28: 5 mg via INTRAVENOUS

## 2015-10-28 MED ORDER — ALBUTEROL SULFATE HFA 108 (90 BASE) MCG/ACT IN AERS
INHALATION_SPRAY | RESPIRATORY_TRACT | Status: DC | PRN
Start: 2015-10-28 — End: 2015-10-28
  Administered 2015-10-28: 8 via RESPIRATORY_TRACT

## 2015-10-28 MED ORDER — 0.9 % SODIUM CHLORIDE (POUR BTL) OPTIME
TOPICAL | Status: DC | PRN
  Administered 2015-10-28: 1000 mL

## 2015-10-28 MED ORDER — GENTAMICIN SULFATE 40 MG/ML IJ SOLN
INTRAMUSCULAR | Status: DC | PRN
  Administered 2015-10-28: 160 mg via INTRAMUSCULAR

## 2015-10-28 MED ORDER — VANCOMYCIN HCL 500 MG IV SOLR
INTRAVENOUS | Status: DC | PRN
  Administered 2015-10-28: 500 mg

## 2015-10-28 MED ORDER — MIDAZOLAM HCL 5 MG/5ML IJ SOLN
INTRAMUSCULAR | Status: DC | PRN
  Administered 2015-10-28: 2 mg via INTRAVENOUS

## 2015-10-28 MED ORDER — FENTANYL CITRATE (PF) 250 MCG/5ML IJ SOLN
INTRAMUSCULAR | Status: AC
  Filled 2015-10-28: qty 5

## 2015-10-28 MED ORDER — GENTAMICIN SULFATE 40 MG/ML IJ SOLN
INTRAMUSCULAR | Status: AC
  Filled 2015-10-28: qty 4

## 2015-10-28 MED ORDER — ONDANSETRON HCL 4 MG/2ML IJ SOLN
INTRAMUSCULAR | Status: DC | PRN
  Administered 2015-10-28: 4 mg via INTRAVENOUS

## 2015-10-28 MED ORDER — HYDROCODONE-ACETAMINOPHEN 5-325 MG PO TABS: 1.0000 | ORAL_TABLET | Freq: Four times a day (QID) | ORAL | Status: DC | PRN

## 2015-10-28 MED ORDER — PROPOFOL 10 MG/ML IV BOLUS
INTRAVENOUS | Status: DC | PRN
  Administered 2015-10-28: 150 mg via INTRAVENOUS

## 2015-10-28 MED ORDER — SODIUM CHLORIDE 0.9 % IR SOLN
Status: DC | PRN
  Administered 2015-10-28: 3000 mL

## 2015-10-28 MED ORDER — VANCOMYCIN HCL 500 MG IV SOLR
INTRAVENOUS | Status: AC
  Filled 2015-10-28: qty 500

## 2015-10-28 MED ORDER — FENTANYL CITRATE (PF) 100 MCG/2ML IJ SOLN
INTRAMUSCULAR | Status: DC | PRN
  Administered 2015-10-28: 100 ug via INTRAVENOUS

## 2015-10-28 MED ORDER — LIDOCAINE HCL (CARDIAC) 20 MG/ML IV SOLN
INTRAVENOUS | Status: DC | PRN
  Administered 2015-10-28: 40 mg via INTRAVENOUS

## 2015-10-28 MED ORDER — LACTATED RINGERS IV SOLN
INTRAVENOUS | Status: DC
  Administered 2015-10-28 (×3): via INTRAVENOUS

## 2015-10-28 MED ORDER — CHLORHEXIDINE GLUCONATE 4 % EX LIQD: 60.0000 mL | Freq: Once | CUTANEOUS | Status: DC

## 2015-10-28 MED ORDER — METOPROLOL SUCCINATE ER 50 MG PO TB24
50.0000 mg | ORAL_TABLET | Freq: Every day | ORAL | Status: DC
  Administered 2015-10-28: 50 mg via ORAL
  Filled 2015-10-28 (×2): qty 1

## 2015-10-28 MED ORDER — PHENYLEPHRINE HCL 10 MG/ML IJ SOLN
INTRAMUSCULAR | Status: DC | PRN
  Administered 2015-10-28: 40 ug via INTRAVENOUS

## 2015-10-28 MED ORDER — CLINDAMYCIN PHOSPHATE 900 MG/50ML IV SOLN
900.0000 mg | INTRAVENOUS | Status: AC
  Administered 2015-10-28: 900 mg via INTRAVENOUS
  Filled 2015-10-28: qty 50

## 2015-10-28 SURGICAL SUPPLY — 44 items
BLADE SURG 10 STRL SS (BLADE) IMPLANT
BLADE SURG 21 STRL SS (BLADE) ×2 IMPLANT
BNDG COHESIVE 4X5 TAN STRL (GAUZE/BANDAGES/DRESSINGS) IMPLANT
BNDG COHESIVE 6X5 TAN STRL LF (GAUZE/BANDAGES/DRESSINGS) IMPLANT
BNDG GAUZE ELAST 4 BULKY (GAUZE/BANDAGES/DRESSINGS) IMPLANT
COVER SURGICAL LIGHT HANDLE (MISCELLANEOUS) ×2 IMPLANT
DRAPE INCISE IOBAN 66X45 STRL (DRAPES) ×2 IMPLANT
DRAPE U-SHAPE 47X51 STRL (DRAPES) ×2 IMPLANT
DRSG ADAPTIC 3X8 NADH LF (GAUZE/BANDAGES/DRESSINGS) ×2 IMPLANT
DURAPREP 26ML APPLICATOR (WOUND CARE) ×2 IMPLANT
ELECT CAUTERY BLADE 6.4 (BLADE) ×2 IMPLANT
ELECT REM PT RETURN 9FT ADLT (ELECTROSURGICAL)
ELECTRODE REM PT RTRN 9FT ADLT (ELECTROSURGICAL) IMPLANT
GAUZE SPONGE 4X4 12PLY STRL (GAUZE/BANDAGES/DRESSINGS) ×2 IMPLANT
GLOVE BIOGEL PI IND STRL 6.5 (GLOVE) ×1 IMPLANT
GLOVE BIOGEL PI IND STRL 9 (GLOVE) ×1 IMPLANT
GLOVE BIOGEL PI INDICATOR 6.5 (GLOVE) ×1
GLOVE BIOGEL PI INDICATOR 9 (GLOVE) ×1
GLOVE SURG ORTHO 9.0 STRL STRW (GLOVE) ×2 IMPLANT
GLOVE SURG SS PI 6.5 STRL IVOR (GLOVE) ×2 IMPLANT
GOWN STRL REUS W/ TWL LRG LVL3 (GOWN DISPOSABLE) ×1 IMPLANT
GOWN STRL REUS W/ TWL XL LVL3 (GOWN DISPOSABLE) ×2 IMPLANT
GOWN STRL REUS W/TWL LRG LVL3 (GOWN DISPOSABLE) ×2
GOWN STRL REUS W/TWL XL LVL3 (GOWN DISPOSABLE) ×4
HANDPIECE INTERPULSE COAX TIP (DISPOSABLE)
KIT BASIN OR (CUSTOM PROCEDURE TRAY) ×2 IMPLANT
KIT ROOM TURNOVER OR (KITS) ×2 IMPLANT
KIT STIMULAN RAPID CURE 5CC (Orthopedic Implant) ×2 IMPLANT
MANIFOLD NEPTUNE II (INSTRUMENTS) ×2 IMPLANT
NS IRRIG 1000ML POUR BTL (IV SOLUTION) ×2 IMPLANT
PACK ORTHO EXTREMITY (CUSTOM PROCEDURE TRAY) ×2 IMPLANT
PAD ARMBOARD 7.5X6 YLW CONV (MISCELLANEOUS) ×6 IMPLANT
PREVENA INCISION MGT 90 150 (MISCELLANEOUS) ×2 IMPLANT
SET HNDPC FAN SPRY TIP SCT (DISPOSABLE) IMPLANT
STOCKINETTE IMPERVIOUS 9X36 MD (GAUZE/BANDAGES/DRESSINGS) IMPLANT
SUT ETHILON 2 0 PSLX (SUTURE) ×4 IMPLANT
SWAB COLLECTION DEVICE MRSA (MISCELLANEOUS) IMPLANT
TOWEL OR 17X24 6PK STRL BLUE (TOWEL DISPOSABLE) ×2 IMPLANT
TOWEL OR 17X26 10 PK STRL BLUE (TOWEL DISPOSABLE) ×2 IMPLANT
TUBE ANAEROBIC SPECIMEN COL (MISCELLANEOUS) IMPLANT
TUBE CONNECTING 12X1/4 (SUCTIONS) ×2 IMPLANT
UNDERPAD 30X30 INCONTINENT (UNDERPADS AND DIAPERS) IMPLANT
WATER STERILE IRR 1000ML POUR (IV SOLUTION) IMPLANT
YANKAUER SUCT BULB TIP NO VENT (SUCTIONS) ×2 IMPLANT

## 2015-10-28 NOTE — Op Note (Signed)
10/28/2015  3:07 PM  PATIENT:  Katherine Peterson    PRE-OPERATIVE DIAGNOSIS:  Wound Dehiscence Right Foot with Charcot collapse of the internal fixation of the medial cuneiform  POST-OPERATIVE DIAGNOSIS:  Same  PROCEDURE:  Right Foot Excision Medial Cuneiform, excision of skin and soft tissue, Place Wound VAC and Antibiotic Beads Local tissue rearrangement for wound closure 5 x 6 cm.  SURGEON:  Newt Minion, MD  PHYSICIAN ASSISTANT:None ANESTHESIA:   General  PREOPERATIVE INDICATIONS:  [name] is a  50 y.o. female with a diagnosis of Wound Dehiscence Right Foot who failed conservative measures and elected for surgical management.    The risks benefits and alternatives were discussed with the patient preoperatively including but not limited to the risks of infection, bleeding, nerve injury, cardiopulmonary complications, the need for revision surgery, among others, and the patient was willing to proceed.  OPERATIVE IMPLANTS: Antibiotic beads with stimulant 5 mL with 500 mg vancomycin and 160 mg gentamicin. Application of Prevena wound VAC.  OPERATIVE FINDINGS: No deep abscess  OPERATIVE PROCEDURE: Patient was brought to the operating room and underwent a general anesthetic. After adequate levels anesthesia were obtained patient's right lower extremity was prepped using DuraPrep draped into a sterile field. A timeout was called. An incision was made with a 21 blade knife to excise skin and soft tissue around the ulcerative area. This left a wound that was 5 x 6 cm. The medial cuneiform and internal fixation that failed and the screw and the medial cuneiform were resected in 1 block of tissue. The wound was irrigated with pulsatile lavage. Antibiotic beads were placed with stimulant 5 mL and 500 mg vancomycin and 160 mg gentamicin. Local tissue rearrangement was performed for wound closure 6 x 5 cm. The Prevena wound VAC was placed. This had a good suction fit. Patient was extubated taken to the  PACU in stable condition.

## 2015-10-28 NOTE — Anesthesia Preprocedure Evaluation (Addendum)
Anesthesia Evaluation  Patient identified by MRN, date of birth, ID band Patient awake    Reviewed: Allergy & Precautions, H&P , NPO status , Patient's Chart, lab work & pertinent test results, reviewed documented beta blocker date and time   Airway Mallampati: II  TM Distance: >3 FB Neck ROM: Full    Dental no notable dental hx. (+) Teeth Intact, Dental Advisory Given   Pulmonary neg pulmonary ROS,    Pulmonary exam normal breath sounds clear to auscultation       Cardiovascular hypertension, Pt. on medications and Pt. on home beta blockers  Rhythm:Regular Rate:Normal     Neuro/Psych Depression negative neurological ROS     GI/Hepatic Neg liver ROS, GERD  Medicated and Controlled,  Endo/Other  diabetes, Type 1, Insulin DependentMorbid obesity  Renal/GU Renal disease  negative genitourinary   Musculoskeletal   Abdominal   Peds  Hematology negative hematology ROS (+)   Anesthesia Other Findings   Reproductive/Obstetrics negative OB ROS                           Anesthesia Physical Anesthesia Plan  ASA: III  Anesthesia Plan: General   Post-op Pain Management:    Induction: Intravenous  Airway Management Planned: Oral ETT and LMA  Additional Equipment:   Intra-op Plan:   Post-operative Plan: Extubation in OR  Informed Consent: I have reviewed the patients History and Physical, chart, labs and discussed the procedure including the risks, benefits and alternatives for the proposed anesthesia with the patient or authorized representative who has indicated his/her understanding and acceptance.   Dental advisory given  Plan Discussed with: CRNA, Anesthesiologist and Surgeon  Anesthesia Plan Comments:       Anesthesia Quick Evaluation

## 2015-10-28 NOTE — Transfer of Care (Signed)
Immediate Anesthesia Transfer of Care Note  Patient: Katherine Peterson  Procedure(s) Performed: Procedure(s): Right Foot Excision Medial Cuneiform, Wound Debridement, Place Wound VAC and Antibiotic Beads (Right)  Patient Location: PACU  Anesthesia Type:General  Level of Consciousness: awake  Airway & Oxygen Therapy: Patient Spontanous Breathing and Patient connected to face mask oxygen  Post-op Assessment: Report given to RN and Post -op Vital signs reviewed and stable  Post vital signs: stable  Last Vitals:  Filed Vitals:   10/28/15 1319  BP: 155/68  Pulse: 70  Temp: 36.6 C  Resp: 20    Complications: No apparent anesthesia complications

## 2015-10-28 NOTE — H&P (Signed)
Katherine Peterson is an 49 y.o. female.   Chief Complaint: Charcot collapse and ulceration right foot HPI: Patient is a 50 year old woman who is status post foot salvage intervention with debridement of the Charcot collapse and placement of internal fixation. Patient has had progressive loss of reduction of the medial cuneiform with development of ulceration. She has failed conservative wound care and presents at this time for surgical intervention for further right foot reconstruction.  Past Medical History  Diagnosis Date  . Diabetes mellitus without complication ([place])   . Hypercholesterolemia   . GERD (gastroesophageal reflux disease)   . Foot fracture, right     dislocation  . Diverticulosis   . Pneumonia   . Depression   . Diabetic neuropathy ([place])   . 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 Hypertension   . Head injury, closed, with brief LOC ([place]) 1998    low blood sugar- "passed out"  . Shortness of breath dyspnea     short of breath - at times when I get out of bed at night  . Chronic kidney disease     stage three-   . Constipation   . Neuropathy ([place])   . Arthritis   . HOH (hard of hearing)     Past Surgical History  Procedure Laterality Date  . Cholecystectomy    . Amputation left foot    . Abdominal hysterectomy    . Skin graft      left  . Shoulder surgery Left     I and D  . Diagnostic laparoscopy      small portion of colon  . Eye surgery Right     "for bleeding"  . Tubal ligation    . Dilation and curettage of uterus    . Open reduction internal fixation (orif) foot lisfranc fracture Right 07/01/2015    Procedure: OPEN REDUCTION INTERNAL FIXATION (ORIF) RIGHT FOOT LISFRANC FRACTURE/DISLOCATION;  Surgeon: Newt Minion, MD;  Location: [facility];  Service: Orthopedics;  Laterality: Right;    Family History  Problem Relation Age of Onset  . Transient ischemic attack Mother   . Diabetes Mother   . Heart disease Father   . Cancer Sister    Social  History:  reports that she has never smoked. She has never used smokeless tobacco. She reports that she does not drink alcohol or use illicit drugs.  Allergies:  Allergies  Allergen Reactions  . Codeine Hives  . Keflex [Cephalexin] Other (See Comments)    Sick to stomach    No prescriptions prior to admission    No results found for this or any previous visit (from the past 67 hour(s)). No results found.  Review of Systems  All other systems reviewed and are negative.   Height 5\' 7"  (1.702 m), weight 113.399 kg (250 lb). Physical Exam  On examination patient has a palpable pulse. She has a ulcer over the medial cuneiform with loss of reduction of the internal fixation with exposed bone. Assessment/Plan Assessment: Charcot collapse with loss of reduction of the medial cuneiform and ulceration with exposed bone.  Plan: We will plan for excision of the medial cuneiform local tissue rearrangement for wound closure placement of antibiotic beads placement of an incisional wound VAC. Risk and benefits were discussed including nonhealing of the wound progressive collapse of the Charcot arthropathy need for additional surgery. Patient states she understands wish to proceed at this time.  Newt Minion, MD 10/28/2015,  6:40 AM

## 2015-10-28 NOTE — Anesthesia Procedure Notes (Signed)
Procedure Name: Intubation Date/Time: 10/28/2015 2:31 PM Performed by: Carney Living Pre-anesthesia Checklist: Patient identified, Emergency Drugs available, Suction available, Patient being monitored and Timeout performed Patient Re-evaluated:Patient Re-evaluated prior to inductionOxygen Delivery Method: Circle system utilized Preoxygenation: Pre-oxygenation with 100% oxygen Intubation Type: IV induction Ventilation: Mask ventilation without difficulty and Oral airway inserted - appropriate to patient size Laryngoscope Size: Mac and 4 Grade View: Grade II Tube type: Oral Tube size: 7.0 mm Number of attempts: 1 Airway Equipment and Method: Stylet Placement Confirmation: ETT inserted through vocal cords under direct vision,  positive ETCO2 and breath sounds checked- equal and bilateral Secured at: 22 cm Tube secured with: Tape Dental Injury: Teeth and Oropharynx as per pre-operative assessment

## 2015-10-29 ENCOUNTER — Encounter (HOSPITAL_COMMUNITY): Payer: Self-pay | Admitting: Orthopedic Surgery

## 2015-10-29 LAB — HEMOGLOBIN A1C
Hgb A1c MFr Bld: 8.3 % — ABNORMAL HIGH (ref 4.8–5.6)
MEAN PLASMA GLUCOSE: 192 mg/dL

## 2015-10-30 NOTE — Anesthesia Postprocedure Evaluation (Signed)
Anesthesia Post Note  Patient: Kieanna Wirkkala  Procedure(s) Performed: Procedure(s) (LRB): Right Foot Excision Medial Cuneiform, Wound Debridement, Place Wound VAC and Antibiotic Beads (Right)  Patient location during evaluation: PACU Anesthesia Type: General Level of consciousness: awake and alert Pain management: pain level controlled Vital Signs Assessment: post-procedure vital signs reviewed and stable Respiratory status: spontaneous breathing, nonlabored ventilation and respiratory function stable Cardiovascular status: blood pressure returned to baseline and stable Postop Assessment: no signs of nausea or vomiting Anesthetic complications: no    Last Vitals:  Filed Vitals:   10/28/15 1625 10/28/15 1635  BP:  122/63  Pulse: 91 91  Temp: 36.6 C   Resp: 13 16    Last Pain:  Filed Vitals:   10/28/15 1635  PainSc: 0-No pain                 Hadasah Brugger A

## 2016-01-11 DIAGNOSIS — G629 Polyneuropathy, unspecified: Secondary | ICD-10-CM | POA: Diagnosis not present

## 2016-01-11 DIAGNOSIS — E11610 Type 2 diabetes mellitus with diabetic neuropathic arthropathy: Secondary | ICD-10-CM | POA: Diagnosis not present

## 2016-01-11 DIAGNOSIS — I10 Essential (primary) hypertension: Secondary | ICD-10-CM | POA: Diagnosis not present

## 2016-01-11 DIAGNOSIS — K219 Gastro-esophageal reflux disease without esophagitis: Secondary | ICD-10-CM | POA: Diagnosis not present

## 2016-01-11 DIAGNOSIS — E1165 Type 2 diabetes mellitus with hyperglycemia: Secondary | ICD-10-CM | POA: Diagnosis not present

## 2016-01-29 DIAGNOSIS — E11610 Type 2 diabetes mellitus with diabetic neuropathic arthropathy: Secondary | ICD-10-CM | POA: Diagnosis not present

## 2016-01-29 DIAGNOSIS — M25571 Pain in right ankle and joints of right foot: Secondary | ICD-10-CM | POA: Diagnosis not present

## 2016-01-29 DIAGNOSIS — E1142 Type 2 diabetes mellitus with diabetic polyneuropathy: Secondary | ICD-10-CM | POA: Diagnosis not present

## 2016-01-29 DIAGNOSIS — S93323S Subluxation of tarsometatarsal joint of unspecified foot, sequela: Secondary | ICD-10-CM | POA: Diagnosis not present

## 2016-02-15 DIAGNOSIS — H11432 Conjunctival hyperemia, left eye: Secondary | ICD-10-CM | POA: Diagnosis not present

## 2016-02-18 DIAGNOSIS — S90821A Blister (nonthermal), right foot, initial encounter: Secondary | ICD-10-CM | POA: Diagnosis not present

## 2016-02-25 DIAGNOSIS — E113591 Type 2 diabetes mellitus with proliferative diabetic retinopathy without macular edema, right eye: Secondary | ICD-10-CM | POA: Diagnosis not present

## 2016-03-30 DIAGNOSIS — E113513 Type 2 diabetes mellitus with proliferative diabetic retinopathy with macular edema, bilateral: Secondary | ICD-10-CM | POA: Diagnosis not present

## 2016-03-30 DIAGNOSIS — H43821 Vitreomacular adhesion, right eye: Secondary | ICD-10-CM | POA: Diagnosis not present

## 2016-04-29 DIAGNOSIS — E113513 Type 2 diabetes mellitus with proliferative diabetic retinopathy with macular edema, bilateral: Secondary | ICD-10-CM | POA: Diagnosis not present

## 2016-05-24 DIAGNOSIS — L03121 Acute lymphangitis of right axilla: Secondary | ICD-10-CM | POA: Diagnosis not present

## 2016-05-24 DIAGNOSIS — R600 Localized edema: Secondary | ICD-10-CM | POA: Diagnosis not present

## 2016-05-25 DIAGNOSIS — R600 Localized edema: Secondary | ICD-10-CM | POA: Diagnosis not present

## 2016-05-25 DIAGNOSIS — M7989 Other specified soft tissue disorders: Secondary | ICD-10-CM | POA: Diagnosis not present

## 2016-05-31 DIAGNOSIS — L03115 Cellulitis of right lower limb: Secondary | ICD-10-CM | POA: Diagnosis not present

## 2016-05-31 DIAGNOSIS — L03121 Acute lymphangitis of right axilla: Secondary | ICD-10-CM | POA: Diagnosis not present

## 2016-05-31 DIAGNOSIS — R601 Generalized edema: Secondary | ICD-10-CM | POA: Diagnosis not present

## 2016-06-02 ENCOUNTER — Ambulatory Visit (INDEPENDENT_AMBULATORY_CARE_PROVIDER_SITE_OTHER): Payer: Medicare Other | Admitting: Orthopedic Surgery

## 2016-06-02 DIAGNOSIS — M25571 Pain in right ankle and joints of right foot: Secondary | ICD-10-CM | POA: Diagnosis not present

## 2016-06-02 DIAGNOSIS — E1142 Type 2 diabetes mellitus with diabetic polyneuropathy: Secondary | ICD-10-CM

## 2016-06-14 DIAGNOSIS — E113513 Type 2 diabetes mellitus with proliferative diabetic retinopathy with macular edema, bilateral: Secondary | ICD-10-CM | POA: Diagnosis not present

## 2016-06-14 DIAGNOSIS — H43821 Vitreomacular adhesion, right eye: Secondary | ICD-10-CM | POA: Diagnosis not present

## 2016-06-30 ENCOUNTER — Ambulatory Visit (INDEPENDENT_AMBULATORY_CARE_PROVIDER_SITE_OTHER): Payer: Medicare Other | Admitting: Orthopedic Surgery

## 2016-06-30 ENCOUNTER — Ambulatory Visit (INDEPENDENT_AMBULATORY_CARE_PROVIDER_SITE_OTHER): Payer: Self-pay

## 2016-06-30 ENCOUNTER — Encounter (INDEPENDENT_AMBULATORY_CARE_PROVIDER_SITE_OTHER): Payer: Self-pay | Admitting: Orthopedic Surgery

## 2016-06-30 VITALS — Ht 67.0 in | Wt 264.0 lb

## 2016-06-30 DIAGNOSIS — M25571 Pain in right ankle and joints of right foot: Secondary | ICD-10-CM

## 2016-06-30 DIAGNOSIS — I87321 Chronic venous hypertension (idiopathic) with inflammation of right lower extremity: Secondary | ICD-10-CM | POA: Diagnosis not present

## 2016-06-30 DIAGNOSIS — E11610 Type 2 diabetes mellitus with diabetic neuropathic arthropathy: Secondary | ICD-10-CM | POA: Diagnosis not present

## 2016-06-30 NOTE — Progress Notes (Signed)
Office Visit Note   Patient: Katherine Peterson           Date of Birth: July 24, 1966           MRN: [id number] Visit Date: 06/30/2016              Requested by: Rory Percy, MD [place], [place] [address] PCP: Rory Percy, MD   Assessment & Plan: Visit Diagnoses:  1. Charcot foot due to diabetes mellitus ([place])   2. Pain in right ankle and joints of right foot   3. Chronic venous hypertension with inflammation involving right side     Plan: We will wrap her right leg with a Dynaflex wraps. Patient states that previous compression socks have caused blisters and made her foot and leg look worse. We will use a compression wraps and she will bring her compression socks with her. We will evaluate for the proper fitting of the socks at follow-up in she will continue with her protective shoe wear do not see indication of active Charcot process no displacement of the ankle or Charcot right foot.  Follow-Up Instructions: Return in about 1 week (around 07/07/2016).   Orders:  Orders Placed This Encounter  Procedures  . XR Ankle 2 Views Right  . XR Foot 2 Views Right   No orders of the defined types were placed in this encounter.     Procedures: No procedures performed   Clinical Data: No additional findings.   Subjective: Chief Complaint  Patient presents with  . Right Foot - Follow-up  . Follow-up    s/p ORIF charcot collapse right foot medial column bony resection of navicular and medial cuneiform 10/28/15    Patient is follow up s/p ORIF charcot collapse right foot medial column bony resection of navicular and medial cuneiform 10/28/15. She is not doing well today. She has persistent pain and massive swelling of the right foot. The pain is radiating up mid lower leg. She states even with her foot elevated at the level of her heart her swelling does not resolve. The patient feels her foot is bruised. She ambulates with a cane today but states she is in a wheelchair  approximately 90% of her day. She is short of breath today and feels also like she may have a cold.     Review of Systems   Objective: Vital Signs: Ht 5\' 7"  (1.702 m)   Wt 264 lb (119.7 kg)   BMI 41.35 kg/m   Physical Exam Patient is alert oriented no adenopathy well-dressed normal affect normal respiratory effort.  Patient indicates in a wheelchair. Examination she has venous stasis swelling in her right leg is tender to palpation from the tibial tubercle through her foot. Patient has no redness no cellulitis no open ulcers no signs of infection. There is no sign of active Charcot process in her foot. Patient complains of crepitation in the ankle with range of motion of the ankle.  Ortho Exam  Specialty Comments:  No specialty comments available.  Imaging: Xr Ankle 2 Views Right  Result Date: 06/30/2016  Two-view radiographs of the right ankle shows a congruent mortise no osteochondral defect Charcot arthropathy of the ankle no widening of the mortise.  Xr Foot 2 Views Right  Result Date: 06/30/2016 Two-view radiographs of the right foot show stable internal fixation for the Charcot arthropathy. No interval loss of reduction no failure of the implants.    PMFS History: Patient Active Problem List   Diagnosis Date  Noted  . Charcot foot due to diabetes mellitus ([place]) 07/01/2015   Past Medical History:  Diagnosis Date  . Arthritis   . Chronic kidney disease    stage three-   . Constipation   . Depression   . Diabetes mellitus without complication ([place])   . Diabetic neuropathy ([place])   . Diverticulosis   . Foot fracture, right    dislocation  . GERD (gastroesophageal reflux disease)   . Head injury, closed, with brief LOC ([place]) 1998   low blood sugar- "passed out"  . 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 HOH (hard of hearing)   . Hypercholesterolemia   . Hypertension   . Neuropathy ([place])   . Pneumonia   . Shortness of breath dyspnea    short of  breath - at times when I get out of bed at night    Family History  Problem Relation Age of Onset  . Transient ischemic attack Mother   . Diabetes Mother   . Heart disease Father   . Cancer Sister     Past Surgical History:  Procedure Laterality Date  . ABDOMINAL HYSTERECTOMY    . amputation left foot    . CHOLECYSTECTOMY    . DIAGNOSTIC LAPAROSCOPY     small portion of colon  . DILATION AND CURETTAGE OF UTERUS    . EYE SURGERY Right    "for bleeding"  . I&D EXTREMITY Right 10/28/2015   Procedure: Right Foot Excision Medial Cuneiform, Wound Debridement, Place Wound VAC and Antibiotic Beads;  Surgeon: Newt Minion, MD;  Location: [facility];  Service: Orthopedics;  Laterality: Right;  . OPEN REDUCTION INTERNAL FIXATION (ORIF) FOOT LISFRANC FRACTURE Right 07/01/2015   Procedure: OPEN REDUCTION INTERNAL FIXATION (ORIF) RIGHT FOOT LISFRANC FRACTURE/DISLOCATION;  Surgeon: Newt Minion, MD;  Location: [facility];  Service: Orthopedics;  Laterality: Right;  . SHOULDER SURGERY Left    I and D  . SKIN GRAFT     left  . TUBAL LIGATION     Social History   Occupational History  . Not on file.   Social History Main Topics  . Smoking status: Never Smoker  . Smokeless tobacco: Never Used  . Alcohol use No  . Drug use: No  . Sexual activity: Not on file

## 2016-07-07 ENCOUNTER — Encounter (INDEPENDENT_AMBULATORY_CARE_PROVIDER_SITE_OTHER): Payer: Self-pay | Admitting: Orthopedic Surgery

## 2016-07-07 ENCOUNTER — Ambulatory Visit (INDEPENDENT_AMBULATORY_CARE_PROVIDER_SITE_OTHER): Payer: Medicare Other | Admitting: Family

## 2016-07-07 VITALS — Ht 67.0 in | Wt 264.0 lb

## 2016-07-07 DIAGNOSIS — I87301 Chronic venous hypertension (idiopathic) without complications of right lower extremity: Secondary | ICD-10-CM

## 2016-07-07 DIAGNOSIS — E11610 Type 2 diabetes mellitus with diabetic neuropathic arthropathy: Secondary | ICD-10-CM

## 2016-07-07 NOTE — Progress Notes (Signed)
Wound Care Note   Patient: Katherine Peterson           Date of Birth: 06-Dec-1965           MRN: [id number]             PCP: Rory Percy, MD Visit Date: 07/07/2016   Assessment & Plan: Visit Diagnoses:  1. Idiopathic chronic venous hypertension of right lower extremity without complications   2. Charcot foot due to diabetes mellitus ([place])     Plan: Resume Vive compression stocking. Have advised either is ok. Needs to wear compression stocking daily. She'll like to follow-up as needed. Will call or return with any concerns.  Follow-Up Instructions: Return if symptoms worsen or fail to improve.  Orders:  No orders of the defined types were placed in this encounter.  No orders of the defined types were placed in this encounter.     Procedures: No notes on file   Clinical Data: No additional findings.   No images are attached to the encounter.   Subjective: Chief Complaint  Patient presents with  . Right Foot - Follow-up    Right foot ORIF charcot collapse right foot column bony resection of navicular and medial cuneiform.  10/28/15    Patient is follow up s/p ORIF charcot collapse right foot medial column bony resection of navicular and medial cuneiform 10/28/15. She was placed in a dynaflex compression wrap last week and advised to bring her compression socks with her to todays visit. Swelling has decreased and the skin is intact. No questions or concerns.   Has 1 vive compression stocking. Also has one pair of another type of 20-30 compression stockings. Wondering which she should wear.   Review of Systems  Constitutional: Negative for chills and fever.  Respiratory: Negative for chest tightness and shortness of breath.   Cardiovascular: Positive for leg swelling.  All other systems reviewed and are negative.    Objective: Vital Signs: Ht 5\' 7"  (1.702 m)   Wt 264 lb (119.7 kg)   BMI 41.35 kg/m   Physical Exam: Patient is alert and oriented. No adenopathy.  Well-dressed. Normal affect. Respirations easy.  Great wrinkling of the skin to right lower extremity. Does have a Charcot deformity of this foot. No erythema, no warmth. No sign of active charcot process. No ulceration. No open areas. No sign of infection.   Specialty Comments: No specialty comments available.   PMFS History: Patient Active Problem List   Diagnosis Date Noted  . Idiopathic chronic venous hypertension of right lower extremity without complications [id number]  . Charcot foot due to diabetes mellitus ([place]) 07/01/2015   Past Medical History:  Diagnosis Date  . Arthritis   . Chronic kidney disease    stage three-   . Constipation   . Depression   . Diabetes mellitus without complication ([place])   . Diabetic neuropathy ([place])   . Diverticulosis   . Foot fracture, right    dislocation  . GERD (gastroesophageal reflux disease)   . Head injury, closed, with brief LOC ([place]) 1998   low blood sugar- "passed out"  . 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 HOH (hard of hearing)   . Hypercholesterolemia   . Hypertension   . Neuropathy ([place])   . Pneumonia   . Shortness of breath dyspnea    short of breath - at times when I get out of bed at night    Family History  Problem Relation Age of Onset  . Transient ischemic attack Mother   . Diabetes Mother   . Heart disease Father   . Cancer Sister    Past Surgical History:  Procedure Laterality Date  . ABDOMINAL HYSTERECTOMY    . amputation left foot    . CHOLECYSTECTOMY    . DIAGNOSTIC LAPAROSCOPY     small portion of colon  . DILATION AND CURETTAGE OF UTERUS    . EYE SURGERY Right    "for bleeding"  . I&D EXTREMITY Right 10/28/2015   Procedure: Right Foot Excision Medial Cuneiform, Wound Debridement, Place Wound VAC and Antibiotic Beads;  Surgeon: Newt Minion, MD;  Location: [facility];  Service: Orthopedics;  Laterality: Right;  . OPEN REDUCTION INTERNAL FIXATION (ORIF) FOOT LISFRANC FRACTURE Right  07/01/2015   Procedure: OPEN REDUCTION INTERNAL FIXATION (ORIF) RIGHT FOOT LISFRANC FRACTURE/DISLOCATION;  Surgeon: Newt Minion, MD;  Location: [facility];  Service: Orthopedics;  Laterality: Right;  . SHOULDER SURGERY Left    I and D  . SKIN GRAFT     left  . TUBAL LIGATION     Social History   Occupational History  . Not on file.   Social History Main Topics  . Smoking status: Never Smoker  . Smokeless tobacco: Never Used  . Alcohol use No  . Drug use: No  . Sexual activity: Not on file

## 2016-07-07 NOTE — Addendum Note (Signed)
Addended by: Dondra Prader R on: 07/07/2016 01:21 PM   Modules accepted: Orders

## 2016-07-12 DIAGNOSIS — E1165 Type 2 diabetes mellitus with hyperglycemia: Secondary | ICD-10-CM | POA: Diagnosis not present

## 2016-07-12 DIAGNOSIS — I10 Essential (primary) hypertension: Secondary | ICD-10-CM | POA: Diagnosis not present

## 2016-07-12 DIAGNOSIS — R05 Cough: Secondary | ICD-10-CM | POA: Diagnosis not present

## 2016-07-12 DIAGNOSIS — F330 Major depressive disorder, recurrent, mild: Secondary | ICD-10-CM | POA: Diagnosis not present

## 2016-07-12 DIAGNOSIS — Z6841 Body Mass Index (BMI) 40.0 and over, adult: Secondary | ICD-10-CM | POA: Diagnosis not present

## 2016-07-12 DIAGNOSIS — E11610 Type 2 diabetes mellitus with diabetic neuropathic arthropathy: Secondary | ICD-10-CM | POA: Diagnosis not present

## 2016-07-18 DIAGNOSIS — I10 Essential (primary) hypertension: Secondary | ICD-10-CM | POA: Diagnosis not present

## 2016-07-18 DIAGNOSIS — E7800 Pure hypercholesterolemia, unspecified: Secondary | ICD-10-CM | POA: Diagnosis not present

## 2016-07-18 DIAGNOSIS — K219 Gastro-esophageal reflux disease without esophagitis: Secondary | ICD-10-CM | POA: Diagnosis not present

## 2016-07-18 DIAGNOSIS — E1165 Type 2 diabetes mellitus with hyperglycemia: Secondary | ICD-10-CM | POA: Diagnosis not present

## 2016-07-20 DIAGNOSIS — M129 Arthropathy, unspecified: Secondary | ICD-10-CM | POA: Diagnosis not present

## 2016-07-20 DIAGNOSIS — D649 Anemia, unspecified: Secondary | ICD-10-CM | POA: Diagnosis not present

## 2016-07-20 DIAGNOSIS — E11622 Type 2 diabetes mellitus with other skin ulcer: Secondary | ICD-10-CM | POA: Diagnosis not present

## 2016-07-20 DIAGNOSIS — E11610 Type 2 diabetes mellitus with diabetic neuropathic arthropathy: Secondary | ICD-10-CM | POA: Diagnosis not present

## 2016-07-20 DIAGNOSIS — F330 Major depressive disorder, recurrent, mild: Secondary | ICD-10-CM | POA: Diagnosis not present

## 2016-07-25 DIAGNOSIS — Z7982 Long term (current) use of aspirin: Secondary | ICD-10-CM | POA: Diagnosis not present

## 2016-07-25 DIAGNOSIS — R079 Chest pain, unspecified: Secondary | ICD-10-CM | POA: Diagnosis not present

## 2016-07-25 DIAGNOSIS — D649 Anemia, unspecified: Secondary | ICD-10-CM | POA: Diagnosis not present

## 2016-07-25 DIAGNOSIS — R05 Cough: Secondary | ICD-10-CM | POA: Diagnosis not present

## 2016-07-25 DIAGNOSIS — E119 Type 2 diabetes mellitus without complications: Secondary | ICD-10-CM | POA: Diagnosis not present

## 2016-07-25 DIAGNOSIS — J069 Acute upper respiratory infection, unspecified: Secondary | ICD-10-CM | POA: Diagnosis not present

## 2016-07-25 DIAGNOSIS — R42 Dizziness and giddiness: Secondary | ICD-10-CM | POA: Diagnosis not present

## 2016-07-25 DIAGNOSIS — J9801 Acute bronchospasm: Secondary | ICD-10-CM | POA: Diagnosis not present

## 2016-07-25 DIAGNOSIS — R55 Syncope and collapse: Secondary | ICD-10-CM | POA: Diagnosis not present

## 2016-07-25 DIAGNOSIS — E7800 Pure hypercholesterolemia, unspecified: Secondary | ICD-10-CM | POA: Diagnosis not present

## 2016-07-25 DIAGNOSIS — J40 Bronchitis, not specified as acute or chronic: Secondary | ICD-10-CM | POA: Diagnosis not present

## 2016-07-25 DIAGNOSIS — Z794 Long term (current) use of insulin: Secondary | ICD-10-CM | POA: Diagnosis not present

## 2016-07-25 DIAGNOSIS — Z79899 Other long term (current) drug therapy: Secondary | ICD-10-CM | POA: Diagnosis not present

## 2016-08-19 DIAGNOSIS — M14679 Charcot's joint, unspecified ankle and foot: Secondary | ICD-10-CM | POA: Diagnosis not present

## 2016-08-24 ENCOUNTER — Encounter (HOSPITAL_COMMUNITY): Payer: Self-pay | Admitting: Oncology

## 2016-08-24 ENCOUNTER — Encounter (HOSPITAL_COMMUNITY): Payer: Medicare Other

## 2016-08-24 ENCOUNTER — Encounter (HOSPITAL_COMMUNITY): Payer: Self-pay | Admitting: Lab

## 2016-08-24 ENCOUNTER — Encounter (HOSPITAL_COMMUNITY): Payer: Medicare Other | Attending: Oncology | Admitting: Oncology

## 2016-08-24 VITALS — BP 133/70 | HR 90 | Temp 97.8°F | Resp 20 | Ht 67.0 in | Wt 241.9 lb

## 2016-08-24 DIAGNOSIS — E7800 Pure hypercholesterolemia, unspecified: Secondary | ICD-10-CM | POA: Insufficient documentation

## 2016-08-24 DIAGNOSIS — Z833 Family history of diabetes mellitus: Secondary | ICD-10-CM | POA: Diagnosis not present

## 2016-08-24 DIAGNOSIS — Z794 Long term (current) use of insulin: Secondary | ICD-10-CM | POA: Diagnosis not present

## 2016-08-24 DIAGNOSIS — D509 Iron deficiency anemia, unspecified: Secondary | ICD-10-CM | POA: Diagnosis not present

## 2016-08-24 DIAGNOSIS — R5383 Other fatigue: Secondary | ICD-10-CM | POA: Diagnosis not present

## 2016-08-24 DIAGNOSIS — Z9889 Other specified postprocedural states: Secondary | ICD-10-CM | POA: Insufficient documentation

## 2016-08-24 DIAGNOSIS — Z809 Family history of malignant neoplasm, unspecified: Secondary | ICD-10-CM | POA: Insufficient documentation

## 2016-08-24 DIAGNOSIS — Z7982 Long term (current) use of aspirin: Secondary | ICD-10-CM | POA: Insufficient documentation

## 2016-08-24 DIAGNOSIS — K219 Gastro-esophageal reflux disease without esophagitis: Secondary | ICD-10-CM | POA: Insufficient documentation

## 2016-08-24 DIAGNOSIS — D72829 Elevated white blood cell count, unspecified: Secondary | ICD-10-CM | POA: Diagnosis not present

## 2016-08-24 DIAGNOSIS — N189 Chronic kidney disease, unspecified: Secondary | ICD-10-CM | POA: Diagnosis not present

## 2016-08-24 DIAGNOSIS — I129 Hypertensive chronic kidney disease with stage 1 through stage 4 chronic kidney disease, or unspecified chronic kidney disease: Secondary | ICD-10-CM | POA: Diagnosis not present

## 2016-08-24 DIAGNOSIS — D473 Essential (hemorrhagic) thrombocythemia: Secondary | ICD-10-CM

## 2016-08-24 DIAGNOSIS — Z9049 Acquired absence of other specified parts of digestive tract: Secondary | ICD-10-CM | POA: Insufficient documentation

## 2016-08-24 DIAGNOSIS — E1140 Type 2 diabetes mellitus with diabetic neuropathy, unspecified: Secondary | ICD-10-CM | POA: Diagnosis not present

## 2016-08-24 DIAGNOSIS — D75839 Thrombocytosis, unspecified: Secondary | ICD-10-CM

## 2016-08-24 DIAGNOSIS — E1122 Type 2 diabetes mellitus with diabetic chronic kidney disease: Secondary | ICD-10-CM | POA: Insufficient documentation

## 2016-08-24 DIAGNOSIS — Z888 Allergy status to other drugs, medicaments and biological substances status: Secondary | ICD-10-CM | POA: Insufficient documentation

## 2016-08-24 DIAGNOSIS — F329 Major depressive disorder, single episode, unspecified: Secondary | ICD-10-CM | POA: Insufficient documentation

## 2016-08-24 DIAGNOSIS — R634 Abnormal weight loss: Secondary | ICD-10-CM | POA: Insufficient documentation

## 2016-08-24 DIAGNOSIS — Z8249 Family history of ischemic heart disease and other diseases of the circulatory system: Secondary | ICD-10-CM | POA: Diagnosis not present

## 2016-08-24 DIAGNOSIS — D72825 Bandemia: Secondary | ICD-10-CM

## 2016-08-24 HISTORY — DX: Iron deficiency anemia, unspecified: D50.9

## 2016-08-24 HISTORY — DX: Elevated white blood cell count, unspecified: D72.829

## 2016-08-24 LAB — COMPREHENSIVE METABOLIC PANEL
ALBUMIN: 2.5 g/dL — AB (ref 3.5–5.0)
ALT: 12 U/L — ABNORMAL LOW (ref 14–54)
ANION GAP: 10 (ref 5–15)
AST: 12 U/L — AB (ref 15–41)
Alkaline Phosphatase: 94 U/L (ref 38–126)
BUN: 17 mg/dL (ref 6–20)
CO2: 26 mmol/L (ref 22–32)
Calcium: 8.9 mg/dL (ref 8.9–10.3)
Chloride: 96 mmol/L — ABNORMAL LOW (ref 101–111)
Creatinine, Ser: 1.04 mg/dL — ABNORMAL HIGH (ref 0.44–1.00)
GFR calc Af Amer: >60 mL/min (ref >60)
GFR calc non Af Amer: >60 mL/min (ref >60)
GLUCOSE: 324 mg/dL — AB (ref 65–99)
POTASSIUM: 4.1 mmol/L (ref 3.5–5.1)
SODIUM: 132 mmol/L — AB (ref 135–145)
Total Bilirubin: 0.5 mg/dL (ref 0.3–1.2)
Total Protein: 8 g/dL (ref 6.5–8.1)

## 2016-08-24 LAB — CBC WITH DIFFERENTIAL/PLATELET
BASOS ABS: 0.1 10*3/uL (ref 0.0–0.1)
BASOS PCT: 1 %
EOS ABS: 0.1 10*3/uL (ref 0.0–0.7)
Eosinophils Relative: 1 %
HEMATOCRIT: 29.2 % — AB (ref 36.0–46.0)
HEMOGLOBIN: 8.9 g/dL — AB (ref 12.0–15.0)
Lymphocytes Relative: 18 %
Lymphs Abs: 2.3 10*3/uL (ref 0.7–4.0)
MCH: 24.7 pg — ABNORMAL LOW (ref 26.0–34.0)
MCHC: 30.5 g/dL (ref 30.0–36.0)
MCV: 80.9 fL (ref 78.0–100.0)
MONO ABS: 0.7 10*3/uL (ref 0.1–1.0)
MONOS PCT: 5 %
NEUTROS ABS: 9.3 10*3/uL — AB (ref 1.7–7.7)
NEUTROS PCT: 75 %
Platelets: 686 10*3/uL — ABNORMAL HIGH (ref 150–400)
RBC: 3.61 MIL/uL — ABNORMAL LOW (ref 3.87–5.11)
RDW: 18.5 % — AB (ref 11.5–15.5)
WBC: 12.3 10*3/uL — ABNORMAL HIGH (ref 4.0–10.5)

## 2016-08-24 LAB — C-REACTIVE PROTEIN: CRP: 11.5 mg/dL — ABNORMAL HIGH (ref <1.0)

## 2016-08-24 LAB — IRON AND TIBC
IRON: 14 ug/dL — AB (ref 28–170)
SATURATION RATIOS: 6 % — AB (ref 10.4–31.8)
TIBC: 252 ug/dL (ref 250–450)
UIBC: 238 ug/dL

## 2016-08-24 LAB — SEDIMENTATION RATE: Sed Rate: 134 mm/hr — ABNORMAL HIGH (ref 0–22)

## 2016-08-24 LAB — RETICULOCYTES
RBC.: 3.61 MIL/uL — AB (ref 3.87–5.11)
RETIC COUNT ABSOLUTE: 75.8 10*3/uL (ref 19.0–186.0)
RETIC CT PCT: 2.1 % (ref 0.4–3.1)

## 2016-08-24 LAB — LACTATE DEHYDROGENASE: LDH: 140 U/L (ref 98–192)

## 2016-08-24 LAB — FERRITIN: Ferritin: 219 ng/mL (ref 11–307)

## 2016-08-24 NOTE — Progress Notes (Unsigned)
Referral sent to Dr Ladona Horns 1/10 @815 

## 2016-08-24 NOTE — Assessment & Plan Note (Addendum)
Microcytic, hypochromic anemia on labs from [facility] on 07/25/2016 when she presented with bronchitis with leukocytosis with neutrophilia and thrombocytosis.  Labs from 07/18/2016 demonstrate a low serum iron, TIBC, and iron saturation and normal ferritin.  Normal folate and B12 on 07/18/2016 labs by primary care provider.  Labs today: CBC diff, CMET, iron/TIBC, ferritin, LDH, ESR, CRP haptoglobin, retic count, EPO, pathology smear review.  Stool cards x 3 are ordered as well.  Will refer patient to Dr. Anthony Sar for colonoscopy (first screening).  Based upon previous lab results, we suspect iron deficiency anemia.  If this is the case, we will recall the patient for IV iron.  I have reviewed the risks, benefits, alternatives, and side effects of IV iron including anaphylaxis.  She will return in 3 weeks for follow-up with repeat labs (CBC diff, BMET, iron/TIBC, ferritin).  She is provided education regarding iron deficiency anemia.  Iron deficiency anemia is the most common anemia.  Beside playing a critical role as an oxygen carrier in the heme group of hemoglobin, iron is found in many key proteins in the cells, such as cytochromes and myoglobin, so it is not unexpected that a lack of iron has effects other than anemia.  Three studies have focused on nonanemic iron deficiency leading to fatigue.  Two studies showed that oral iron supplementation reduces fatigue, with no significant change in hemoglobin levels, in women with a ferritin level of less than 50 ng/mL, and a third study showed a lessening of fatigue with parental iron administration in women with a ferritin level of 15 ng/mL or less or an iron saturation of 20% or less.   Owing to obligate iron loss through menses, women are at greater risk for iron deficiency than men.  Iron loss in all women averages 1-3 ng per day, and dietary intake is often inadequate to maintain a positive iron balance.  A 1967 study showed that 25% of  healthy, college-age women had no bone marrow iron stores and that another 33% had low stores.  Pregnancy adds to demands for iron, with requirements increasing to 6 ng per day by the end of pregnancy.  Athletes are another group at risk for iron deficiency.  Gastrointestinal tract blood is the source of iron loss, and exercise-induced hemolysis leads to urinary iron losses.  Decreased absorption of iron has also been implicated as a cause of iron deficiency, because of levels of hepcidin are often elevated in athletes owing to training-induced inflammation.    Obesity and its surgical treatment are also at risk factors for iron deficiency.  Obese patients are often iron-deficient, with increased hepcidin level being implicated in decreased absorption.  After bariatric surgery, the incidence of iron deficiency can be as high as 50%.  Because the main site of iron absorption is the duodenum, surgeries that involve bypassing this part of the bowel are associated with an increased incidence of iron deficiency.  However, iron deficiency is seen as a sequela of most types of bariatric surgery.    -NEJM Volume 371, No 14, pg [id number]

## 2016-08-24 NOTE — Progress Notes (Signed)
[facility] Hematology/Oncology Consultation   Name: Katherine Peterson      MRN: [id number]    Date: 08/24/2016 Time:6:44 PM   REFERRING PHYSICIAN:  Rory Percy, MD (Primary Care Provider)  REASON FOR CONSULT:  Anemia   DIAGNOSIS:  Microcytic, hypochromic anemia  HISTORY OF PRESENT ILLNESS:   Katherine Peterson is a 51 y.o. female with a medical history significant for type II DM insulin dependent, poorly controlled, depression, arthopathy who is referred to the [facility] for anemia evaluation.  The patient is concerned about being at the "[place]."  I was able to put her mind at ease by explaining our role in her care and the reason for her referral today: anemia.  The patient has a list of complaints that are note hematologically related, but she was agreeable to follow-up with her primary care provider regarding these issues as they are chronic.    She has a pretty complicated diabetic history as well.  She has been a diabetic since 1986.  She charcot of right foot requiring 2 surgeries by Dr. Sharol Given.  She had left partial foot amputation.  She denies any hematology issues.  She denies any B symptoms.  She reports fatigue and tiredness.  She is in a wheelchair today.  She denies any abdominal or chest pain.  She denies any blood in her stool, dark stool, hematuria, vaginal bleeding, blood loss.  She denies any pica or pagophagia.  She denies craving ice, clay, dirt, starch.  She reports a poor appetite with a reported weight loss of 10 lbs since November.  She reports a recent ED visit to [facility] for bronchitis.  Labs from her ED visit are reviewed and attached below.  At that time, she was significantly anemic, with hypochromia and microcytosis.  She is not up to date on colonoscopy.  She was scheduled for colonoscopy with Dr. Anthony Sar recently, but this was cancelled due to an issue with bronchitis.  She has never had a colonoscopy.  She is not up to date on  mammogram either.  Her last one was 2.5 years ago, she reports.  It was negative.  Review of Systems  Constitutional: Positive for malaise/fatigue and weight loss (Patient reported 10 lbs weight loss x 2 months). Negative for chills and fever.  HENT: Positive for hearing loss. Negative for nosebleeds.   Eyes: Negative.   Respiratory: Positive for hemoptysis. Negative for cough.   Cardiovascular: Negative.  Negative for chest pain.  Gastrointestinal: Negative.  Negative for abdominal pain, blood in stool, constipation, diarrhea, melena, nausea and vomiting.  Genitourinary: Negative.   Musculoskeletal: Negative.   Skin: Negative.  Negative for rash.  Neurological: Positive for weakness.  Endo/Heme/Allergies: Negative.  Does not bruise/bleed easily.  Psychiatric/Behavioral: Positive for depression. Negative for substance abuse. The patient is nervous/anxious.     PAST MEDICAL HISTORY:   Past Medical History:  Diagnosis Date  . Anemia   . Arthritis   . Cataract    left eye  . Chronic kidney disease    stage three-   . Constipation   . Depression   . Diabetes mellitus without complication ([place])   . Diabetic neuropathy ([place])   . Diverticulosis   . Foot fracture, right    dislocation  . GERD (gastroesophageal reflux disease)   . Head injury, closed, with brief LOC ([place]) 1998   low blood sugar- "passed out"  . Hearing impaired    deaf in  right and HOH in left  . HOH (hard of hearing)   . Hypercholesterolemia   . Hypertension   . Leukocytosis 08/24/2016  . Microcytic hypochromic anemia 08/24/2016  . Neuropathy ([place])   . Pneumonia   . Shortness of breath dyspnea    short of breath - at times when I get out of bed at night    ALLERGIES: Allergies  Allergen Reactions  . Codeine Hives  . Keflex [Cephalexin] Other (See Comments)    Sick to stomach      MEDICATIONS: I have reviewed the patient's current medications.    Current Outpatient Prescriptions on File Prior to Visit    Medication Sig Dispense Refill  . acetaminophen (TYLENOL) 500 MG tablet Take 1,000 mg by mouth every 6 (six) hours as needed for moderate pain.    Marland Kitchen aspirin EC 81 MG tablet Take 81 mg by mouth daily.    Marland Kitchen escitalopram (LEXAPRO) 10 MG tablet Take 10 mg by mouth daily.  2  . insulin NPH Human (HUMULIN N,NOVOLIN N) 100 UNIT/ML injection Inject 50-80 Units into the skin 2 (two) times daily. Inject 50 units subque in the morning and 80 units subque in the evening    . losartan-hydrochlorothiazide (HYZAAR) 100-25 MG tablet Take 1 tablet by mouth daily.    . metoprolol succinate (TOPROL-XL) 50 MG 24 hr tablet Take 50 mg by mouth daily. Take with or immediately following a meal.    . omeprazole (PRILOSEC OTC) 20 MG tablet Take 20 mg by mouth daily.    . pravastatin (PRAVACHOL) 40 MG tablet Take 40 mg by mouth daily.     No current facility-administered medications on file prior to visit.      PAST SURGICAL HISTORY Past Surgical History:  Procedure Laterality Date  . ABDOMINAL HYSTERECTOMY    . amputation left foot    . CHOLECYSTECTOMY    . DIAGNOSTIC LAPAROSCOPY     small portion of colon  . DILATION AND CURETTAGE OF UTERUS    . EYE SURGERY Right    "for bleeding"  . I&D EXTREMITY Right 10/28/2015   Procedure: Right Foot Excision Medial Cuneiform, Wound Debridement, Place Wound VAC and Antibiotic Beads;  Surgeon: Newt Minion, MD;  Location: [place];  Service: Orthopedics;  Laterality: Right;  . OPEN REDUCTION INTERNAL FIXATION (ORIF) FOOT LISFRANC FRACTURE Right 07/01/2015   Procedure: OPEN REDUCTION INTERNAL FIXATION (ORIF) RIGHT FOOT LISFRANC FRACTURE/DISLOCATION;  Surgeon: Newt Minion, MD;  Location: [place];  Service: Orthopedics;  Laterality: Right;  . SHOULDER SURGERY Left    I and D  . SKIN GRAFT     left  . TUBAL LIGATION      FAMILY HISTORY: Family History  Problem Relation Age of Onset  . Transient ischemic attack Mother   . Diabetes Mother   . Heart disease Father   .  Cancer Sister     SOCIAL HISTORY:  reports that she has never smoked. She has never used smokeless tobacco. She reports that she does not drink alcohol or use drugs.  She is on disability from her DM complications.  She was a substitute Education officer, museum.  She is married x 34 years.  Social History   Social History  . Marital status: Married    Spouse name: N/A  . Number of children: N/A  . Years of education: N/A   Social History Main Topics  . Smoking status: Never Smoker  . Smokeless tobacco: Never Used  . Alcohol use No  .  Drug use: No  . Sexual activity: Not Asked   Other Topics Concern  . None   Social History Narrative  . None    PERFORMANCE STATUS: The patient's performance status is 2 - Symptomatic, <50% confined to bed  PHYSICAL EXAM: Most Recent Vital Signs: Blood pressure 133/70, pulse 90, temperature 97.8 F (36.6 C), temperature source Oral, resp. rate 20, height _0  (1.702 m), weight 241 lb 14.4 oz (109.7 kg), SpO2 99 %. General appearance: alert, cooperative, combative, fatigued, no distress, moderately obese and pale, accompanied by her husband, in wheelchair. Head: Normocephalic, without obvious abnormality, atraumatic Throat: normal findings: lips normal without lesions, buccal mucosa normal, tongue midline and normal and oropharynx pink & moist without lesions or evidence of thrush Neck: no adenopathy and supple, symmetrical, trachea midline Lungs: clear to auscultation bilaterally Heart: regular rate and rhythm Abdomen: normal findings: bowel sounds normal and soft, non-tender Extremities: B/L LE edema, 1 + pitting.  Patient reported left 3 toes amputated Skin: Skin color, texture, turgor normal. No rashes or lesions Lymph nodes: Cervical, supraclavicular, and axillary nodes normal. Neurologic: Grossly normal  LABORATORY DATA:                RADIOGRAPHY: No results found.     PATHOLOGY:  N/A  ASSESSMENT/PLAN:  Microcytic  hypochromic anemia Microcytic, hypochromic anemia on labs from [facility] on 07/25/2016 when she presented with bronchitis with leukocytosis with neutrophilia and thrombocytosis.  Labs from 07/18/2016 demonstrate a low serum iron, TIBC, and iron saturation and normal ferritin.  Normal folate and B12 on 07/18/2016 labs by primary care provider.  Labs today: CBC diff, CMET, iron/TIBC, ferritin, LDH, ESR, CRP haptoglobin, retic count, EPO, pathology smear review.  Stool cards x 3 are ordered as well.  Will refer patient to Dr. Anthony Sar for colonoscopy (first screening).  Based upon previous lab results, we suspect iron deficiency anemia.  If this is the case, we will recall the patient for IV iron.  I have reviewed the risks, benefits, alternatives, and side effects of IV iron including anaphylaxis.  She will return in 3 weeks for follow-up with repeat labs (CBC diff, BMET, iron/TIBC, ferritin).  She is provided education regarding iron deficiency anemia.  Iron deficiency anemia is the most common anemia.  Beside playing a critical role as an oxygen carrier in the heme group of hemoglobin, iron is found in many key proteins in the cells, such as cytochromes and myoglobin, so it is not unexpected that a lack of iron has effects other than anemia.  Three studies have focused on nonanemic iron deficiency leading to fatigue.  Two studies showed that oral iron supplementation reduces fatigue, with no significant change in hemoglobin levels, in women with a ferritin level of less than 50 ng/mL, and a third study showed a lessening of fatigue with parental iron administration in women with a ferritin level of 15 ng/mL or less or an iron saturation of 20% or less.   Owing to obligate iron loss through menses, women are at greater risk for iron deficiency than men.  Iron loss in all women averages 1-3 ng per day, and dietary intake is often inadequate to maintain a positive iron balance.  A 1967 study  showed that 25% of healthy, college-age women had no bone marrow iron stores and that another 33% had low stores.  Pregnancy adds to demands for iron, with requirements increasing to 6 ng per day by the end of pregnancy.  Athletes are another group  at risk for iron deficiency.  Gastrointestinal tract blood is the source of iron loss, and exercise-induced hemolysis leads to urinary iron losses.  Decreased absorption of iron has also been implicated as a cause of iron deficiency, because of levels of hepcidin are often elevated in athletes owing to training-induced inflammation.    Obesity and its surgical treatment are also at risk factors for iron deficiency.  Obese patients are often iron-deficient, with increased hepcidin level being implicated in decreased absorption.  After bariatric surgery, the incidence of iron deficiency can be as high as 50%.  Because the main site of iron absorption is the duodenum, surgeries that involve bypassing this part of the bowel are associated with an increased incidence of iron deficiency.  However, iron deficiency is seen as a sequela of most types of bariatric surgery.    -NEJM Volume 371, No 14, pg 1325-1326   Leukocytosis Leukocytosis with neutrophilia, unknown etiology.  Labs today: CBC diff, CMET, LDH, ESR, CRP, BCR/ABL, and JAK2 with reflex to CALR/MPL/JAK exon 12.   ORDERS PLACED FOR THIS ENCOUNTER: Orders Placed This Encounter  Procedures  . CBC with Differential  . Comprehensive metabolic panel  . Lactate dehydrogenase  . Sedimentation rate  . Reticulocytes  . Erythropoietin  . Iron and TIBC  . Ferritin  . Haptoglobin  . BCR-ABL1, CML/ALL, PCR, QUANT  . JAK2 V617F, w Reflex to CALR/E12/MPL  . Pathologist smear review  . C-reactive protein  . Occult blood card to lab, stool  . Occult blood card to lab, stool  . Occult blood card to lab, stool  . CBC with Differential  . Basic metabolic panel  . Ferritin  . Iron and TIBC     MEDICATIONS PRESCRIBED THIS ENCOUNTER: Meds ordered this encounter  Medications  . clonazePAM (KLONOPIN) 0.5 MG tablet    Sig: Take 0.5 mg by mouth daily as needed for anxiety.  . metFORMIN (GLUCOPHAGE-XR) 500 MG 24 hr tablet    Sig: Take 500 mg by mouth daily with breakfast.  . albuterol (PROVENTIL HFA;VENTOLIN HFA) 108 (90 Base) MCG/ACT inhaler    Sig: Inhale 2 puffs into the lungs every 4 (four) hours as needed for wheezing or shortness of breath.    All questions were answered. The patient knows to call the clinic with any problems, questions or concerns. We can certainly see the patient much sooner if necessary.  This note is electronically signed Reuben Likes, MD :08/24/2016 6:44 PM

## 2016-08-24 NOTE — Assessment & Plan Note (Signed)
Leukocytosis with neutrophilia, unknown etiology.  Labs today: CBC diff, CMET, LDH, ESR, CRP, BCR/ABL, and JAK2 with reflex to CALR/MPL/JAK exon 12.

## 2016-08-24 NOTE — Patient Instructions (Addendum)
[place] at [facility] Discharge Instructions  RECOMMENDATIONS MADE BY THE CONSULTANT AND ANY TEST RESULTS WILL BE SENT TO YOUR REFERRING PHYSICIAN.  Labs today.   Return in 3-4 weeks for labs   Refer back to Dr. Anthony Sar for Colonoscopy  Thank you for choosing [place] at [facility] to provide your oncology and hematology care.  To afford each patient quality time with our provider, please arrive at least 15 minutes before your scheduled appointment time.    If you have a lab appointment with the [facility] please come in thru the  Main Entrance and check in at the main information desk  You need to re-schedule your appointment should you arrive 10 or more minutes late.  We strive to give you quality time with our providers, and arriving late affects you and other patients whose appointments are after yours.  Also, if you no show three or more times for appointments you may be dismissed from the clinic at the providers discretion.     Again, thank you for choosing [facility].  Our hope is that these requests will decrease the amount of time that you wait before being seen by our physicians.       _____________________________________________________________  Should you have questions after your visit to [facility], please contact our office at [phone] between the hours of 8:30 a.m. and 4:30 p.m.  Voicemails left after 4:30 p.m. will not be returned until the following business day.  For prescription refill requests, have your pharmacy contact our office.       Resources For Cancer Patients and their Caregivers ? American Cancer Society: Can assist with transportation, wigs, general needs, runs Look Good Feel Better.        415-052-7742 ? Cancer Care: Provides financial assistance, online support groups, medication/co-pay assistance.  1-800-813-HOPE (260)194-3850) ? [facility] Assists [place] cancer patients and their families through emotional , educational and financial support.  661 378 4888 ? [place] DSS Where to apply for food stamps, Medicaid and utility assistance. 380 681 1724 ? [facility]: Transportation to medical appointments. (570)415-1887 ? Social Security Administration: May apply for disability if have a Stage IV cancer. 434-410-0136 509-008-5566 ? [employer], Disability and Transit Services: Assists with nutrition, care and transit needs. [place] Support Programs: @10RELATIVEDAYS @ > Cancer Support Group  2nd Tuesday of the month 1pm-2pm, Journey Room  > Creative Journey  3rd Tuesday of the month 1130am-1pm, Journey Room  > Look Good Feel Better  1st Wednesday of the month 10am-12 noon, Journey Room (Call [place] to register [phone])

## 2016-08-25 LAB — HAPTOGLOBIN: HAPTOGLOBIN: 639 mg/dL — AB (ref 34–200)

## 2016-08-26 LAB — ERYTHROPOIETIN: Erythropoietin: 39.8 m[IU]/mL — ABNORMAL HIGH (ref 2.6–18.5)

## 2016-08-26 LAB — PATHOLOGIST SMEAR REVIEW

## 2016-08-30 DIAGNOSIS — R5383 Other fatigue: Secondary | ICD-10-CM | POA: Diagnosis not present

## 2016-08-30 DIAGNOSIS — Z794 Long term (current) use of insulin: Secondary | ICD-10-CM | POA: Diagnosis not present

## 2016-08-30 DIAGNOSIS — R634 Abnormal weight loss: Secondary | ICD-10-CM | POA: Diagnosis not present

## 2016-08-30 DIAGNOSIS — E1140 Type 2 diabetes mellitus with diabetic neuropathy, unspecified: Secondary | ICD-10-CM | POA: Diagnosis not present

## 2016-08-30 DIAGNOSIS — F329 Major depressive disorder, single episode, unspecified: Secondary | ICD-10-CM | POA: Diagnosis not present

## 2016-08-30 DIAGNOSIS — K219 Gastro-esophageal reflux disease without esophagitis: Secondary | ICD-10-CM | POA: Diagnosis not present

## 2016-08-30 DIAGNOSIS — E1122 Type 2 diabetes mellitus with diabetic chronic kidney disease: Secondary | ICD-10-CM | POA: Diagnosis not present

## 2016-08-30 DIAGNOSIS — I129 Hypertensive chronic kidney disease with stage 1 through stage 4 chronic kidney disease, or unspecified chronic kidney disease: Secondary | ICD-10-CM | POA: Diagnosis not present

## 2016-08-30 DIAGNOSIS — N189 Chronic kidney disease, unspecified: Secondary | ICD-10-CM | POA: Diagnosis not present

## 2016-08-30 DIAGNOSIS — D509 Iron deficiency anemia, unspecified: Secondary | ICD-10-CM | POA: Diagnosis not present

## 2016-08-30 LAB — BCR-ABL1, CML/ALL, PCR, QUANT

## 2016-08-31 DIAGNOSIS — D649 Anemia, unspecified: Secondary | ICD-10-CM | POA: Diagnosis not present

## 2016-09-05 LAB — CALR + JAK2 E12-15 + MPL (REFLEXED)

## 2016-09-05 LAB — JAK2 V617F, W REFLEX TO CALR/E12/MPL

## 2016-09-08 DIAGNOSIS — D649 Anemia, unspecified: Secondary | ICD-10-CM | POA: Diagnosis not present

## 2016-09-08 DIAGNOSIS — F329 Major depressive disorder, single episode, unspecified: Secondary | ICD-10-CM | POA: Diagnosis not present

## 2016-09-08 DIAGNOSIS — E669 Obesity, unspecified: Secondary | ICD-10-CM | POA: Diagnosis not present

## 2016-09-08 DIAGNOSIS — K5900 Constipation, unspecified: Secondary | ICD-10-CM | POA: Diagnosis not present

## 2016-09-08 DIAGNOSIS — E119 Type 2 diabetes mellitus without complications: Secondary | ICD-10-CM | POA: Diagnosis not present

## 2016-09-08 DIAGNOSIS — Z9049 Acquired absence of other specified parts of digestive tract: Secondary | ICD-10-CM | POA: Diagnosis not present

## 2016-09-08 DIAGNOSIS — M1990 Unspecified osteoarthritis, unspecified site: Secondary | ICD-10-CM | POA: Diagnosis not present

## 2016-09-08 DIAGNOSIS — Z1211 Encounter for screening for malignant neoplasm of colon: Secondary | ICD-10-CM | POA: Diagnosis not present

## 2016-09-08 DIAGNOSIS — E7800 Pure hypercholesterolemia, unspecified: Secondary | ICD-10-CM | POA: Diagnosis not present

## 2016-09-08 DIAGNOSIS — I10 Essential (primary) hypertension: Secondary | ICD-10-CM | POA: Diagnosis not present

## 2016-09-08 DIAGNOSIS — Z79899 Other long term (current) drug therapy: Secondary | ICD-10-CM | POA: Diagnosis not present

## 2016-09-08 DIAGNOSIS — Z90710 Acquired absence of both cervix and uterus: Secondary | ICD-10-CM | POA: Diagnosis not present

## 2016-09-19 ENCOUNTER — Encounter (HOSPITAL_COMMUNITY): Payer: Medicare Other

## 2016-09-19 ENCOUNTER — Encounter (HOSPITAL_BASED_OUTPATIENT_CLINIC_OR_DEPARTMENT_OTHER): Payer: Medicare Other | Admitting: Adult Health

## 2016-09-19 ENCOUNTER — Encounter (HOSPITAL_COMMUNITY): Payer: Self-pay | Admitting: Adult Health

## 2016-09-19 VITALS — BP 139/64 | HR 79 | Temp 98.0°F | Resp 18 | Wt 255.3 lb

## 2016-09-19 DIAGNOSIS — D509 Iron deficiency anemia, unspecified: Secondary | ICD-10-CM | POA: Diagnosis not present

## 2016-09-19 DIAGNOSIS — R7982 Elevated C-reactive protein (CRP): Secondary | ICD-10-CM

## 2016-09-19 DIAGNOSIS — R1011 Right upper quadrant pain: Secondary | ICD-10-CM

## 2016-09-19 LAB — CBC WITH DIFFERENTIAL/PLATELET
BASOS ABS: 0.1 10*3/uL (ref 0.0–0.1)
Basophils Relative: 0 %
EOS ABS: 0.3 10*3/uL (ref 0.0–0.7)
EOS PCT: 2 %
HCT: 27.4 % — ABNORMAL LOW (ref 36.0–46.0)
Hemoglobin: 8.6 g/dL — ABNORMAL LOW (ref 12.0–15.0)
LYMPHS PCT: 24 %
Lymphs Abs: 3.1 10*3/uL (ref 0.7–4.0)
MCH: 24.5 pg — AB (ref 26.0–34.0)
MCHC: 31.4 g/dL (ref 30.0–36.0)
MCV: 78.1 fL (ref 78.0–100.0)
MONO ABS: 0.9 10*3/uL (ref 0.1–1.0)
Monocytes Relative: 7 %
Neutro Abs: 8.6 10*3/uL — ABNORMAL HIGH (ref 1.7–7.7)
Neutrophils Relative %: 67 %
Platelets: 573 10*3/uL — ABNORMAL HIGH (ref 150–400)
RBC: 3.51 MIL/uL — AB (ref 3.87–5.11)
RDW: 18.4 % — AB (ref 11.5–15.5)
WBC: 12.8 10*3/uL — AB (ref 4.0–10.5)

## 2016-09-19 LAB — BASIC METABOLIC PANEL
ANION GAP: 10 (ref 5–15)
BUN: 17 mg/dL (ref 6–20)
CALCIUM: 8.5 mg/dL — AB (ref 8.9–10.3)
CO2: 27 mmol/L (ref 22–32)
Chloride: 99 mmol/L — ABNORMAL LOW (ref 101–111)
Creatinine, Ser: 0.92 mg/dL (ref 0.44–1.00)
GFR calc Af Amer: >60 mL/min (ref >60)
GFR calc non Af Amer: >60 mL/min (ref >60)
GLUCOSE: 113 mg/dL — AB (ref 65–99)
POTASSIUM: 3.9 mmol/L (ref 3.5–5.1)
SODIUM: 136 mmol/L (ref 135–145)

## 2016-09-19 LAB — C-REACTIVE PROTEIN: CRP: 9.2 mg/dL — ABNORMAL HIGH (ref <1.0)

## 2016-09-19 LAB — IRON AND TIBC
Iron: 16 ug/dL — ABNORMAL LOW (ref 28–170)
Saturation Ratios: 7 % — ABNORMAL LOW (ref 10.4–31.8)
TIBC: 239 ug/dL — ABNORMAL LOW (ref 250–450)
UIBC: 223 ug/dL

## 2016-09-19 LAB — FERRITIN: FERRITIN: 153 ng/mL (ref 11–307)

## 2016-09-19 LAB — RETICULOCYTES
RBC.: 3.46 MIL/uL — AB (ref 3.87–5.11)
RETIC COUNT ABSOLUTE: 48.4 10*3/uL (ref 19.0–186.0)
Retic Ct Pct: 1.4 % (ref 0.4–3.1)

## 2016-09-19 LAB — SEDIMENTATION RATE: SED RATE: 138 mm/h — AB (ref 0–22)

## 2016-09-19 NOTE — Progress Notes (Signed)
[facility]:  Medical Oncology/Hematology  REASON FOR VISIT:  Follow-up for microcytic, hypochromic anemia (likely iron deficiency anemia)  REFERRING PHYSICIAN:   Rory Percy, MD (Primary Care Provider)  CURRENT THERAPY: Initial work-up    HISTORY OF PRESENT ILLNESS:   Katherine Peterson is a 51 y.o. female with a medical history significant for type II DM insulin dependent-poorly controlled, depression, arthopathy who is referred to the [facility] for anemia evaluation.  She is here today to review recent laboratory studies and develop plan of care.    INTERVAL HISTORY:  -She presents with her husband today.  Endorses fatigue.  She was recently diagnosed with bronchitis "and a little bit of pneumonia."  She was reportedly treated with several rounds of antibiotics.  She has some residual cough; denies fever/chills/night sweats.    Her appetite "comes & goes."  She has gained about 10 lbs; "I gained back all of the weight I lost."  She endorses occasional headaches and dizziness, which have been worse recently.    Denies any frank bleeding with bowel movements or dark/tarry stools.  She tells me she did have a colonoscopy on 09/08/16 with Dr. Ladona Horns, which was reportedly negative for polyps or bleeding per patient.  She was told to return in 10 years for continued colon cancer screening.  She denies any pica.   She is seen in a wheelchair today. Reports that she uses the wheelchair given her diabetes complications led to several toes requiring amputation and it is difficult for her to walk.  She is on disability secondary to these diabetes complications as well.     REVIEW OF SYSTEMS:  Review of Systems  Constitutional: Positive for malaise/fatigue. Negative for chills and fever. Weight loss: Patient reported 10 lbs weight loss x 2 months.       "My appetite comes and goes"   HENT: Negative for nosebleeds.   Eyes: Negative.   Respiratory:  Positive for cough (recently treated for "severe bronchitis and a little bit of pneumonia").   Cardiovascular: Negative.  Negative for chest pain and palpitations.  Gastrointestinal: Positive for abdominal pain. Negative for blood in stool, constipation, diarrhea, melena, nausea and vomiting.  Genitourinary: Negative.  Negative for hematuria.  Musculoskeletal: Negative.   Skin: Negative.  Negative for rash.  Neurological: Positive for dizziness, weakness and headaches.  Endo/Heme/Allergies: Negative.  Does not bruise/bleed easily.  Psychiatric/Behavioral: Positive for depression. The patient is nervous/anxious.     PAST MEDICAL HISTORY:   Past Medical History:  Diagnosis Date  . Anemia   . Arthritis   . Cataract    left eye  . Chronic kidney disease    stage three-   . Constipation   . Depression   . Diabetes mellitus without complication ([place])   . Diabetic neuropathy ([place])   . Diverticulosis   . Foot fracture, right    dislocation  . GERD (gastroesophageal reflux disease)   . Head injury, closed, with brief LOC ([place]) 1998   low blood sugar- "passed out"  . 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 HOH (hard of hearing)   . Hypercholesterolemia   . Hypertension   . Leukocytosis 08/24/2016  . Microcytic hypochromic anemia 08/24/2016  . Neuropathy ([place])   . Pneumonia   . Shortness of breath dyspnea    short of breath - at times when I get out of bed at night  ALLERGIES: Allergies  Allergen Reactions  . Codeine Hives  . Keflex [Cephalexin] Other (See Comments)    Sick to stomach      MEDICATIONS: I have reviewed the patient's current medications.    Current Outpatient Prescriptions on File Prior to Visit  Medication Sig Dispense Refill  . acetaminophen (TYLENOL) 500 MG tablet Take 1,000 mg by mouth every 6 (six) hours as needed for moderate pain.    Marland Kitchen albuterol (PROVENTIL HFA;VENTOLIN HFA) 108 (90 Base) MCG/ACT inhaler Inhale 2 puffs into the lungs every  4 (four) hours as needed for wheezing or shortness of breath.    Marland Kitchen aspirin EC 81 MG tablet Take 81 mg by mouth daily.    . clonazePAM (KLONOPIN) 0.5 MG tablet Take 0.5 mg by mouth daily as needed for anxiety.    Marland Kitchen escitalopram (LEXAPRO) 10 MG tablet Take 10 mg by mouth daily.  2  . insulin NPH Human (HUMULIN N,NOVOLIN N) 100 UNIT/ML injection Inject 50-80 Units into the skin 2 (two) times daily. Inject 50 units subque in the morning and 80 units subque in the evening    . losartan-hydrochlorothiazide (HYZAAR) 100-25 MG tablet Take 1 tablet by mouth daily.    . metFORMIN (GLUCOPHAGE-XR) 500 MG 24 hr tablet Take 500 mg by mouth daily with breakfast.    . metoprolol succinate (TOPROL-XL) 50 MG 24 hr tablet Take 50 mg by mouth daily. Take with or immediately following a meal.    . omeprazole (PRILOSEC OTC) 20 MG tablet Take 20 mg by mouth daily.    . pravastatin (PRAVACHOL) 40 MG tablet Take 40 mg by mouth daily.     No current facility-administered medications on file prior to visit.      PAST SURGICAL HISTORY Past Surgical History:  Procedure Laterality Date  . ABDOMINAL HYSTERECTOMY    . amputation left foot    . CHOLECYSTECTOMY    . DIAGNOSTIC LAPAROSCOPY     small portion of colon  . DILATION AND CURETTAGE OF UTERUS    . EYE SURGERY Right    "for bleeding"  . I&D EXTREMITY Right 10/28/2015   Procedure: Right Foot Excision Medial Cuneiform, Wound Debridement, Place Wound VAC and Antibiotic Beads;  Surgeon: Newt Minion, MD;  Location: [facility];  Service: Orthopedics;  Laterality: Right;  . OPEN REDUCTION INTERNAL FIXATION (ORIF) FOOT LISFRANC FRACTURE Right 07/01/2015   Procedure: OPEN REDUCTION INTERNAL FIXATION (ORIF) RIGHT FOOT LISFRANC FRACTURE/DISLOCATION;  Surgeon: Newt Minion, MD;  Location: [place] [facility];  Service: Orthopedics;  Laterality: Right;  . SHOULDER SURGERY Left    I and D  . SKIN GRAFT     left  . TUBAL LIGATION      FAMILY HISTORY: Family History  Problem Relation Age  of Onset  . Transient ischemic attack Mother   . Diabetes Mother   . Heart disease Father   . Cancer Sister     SOCIAL HISTORY:  reports that she has never smoked. She has never used smokeless tobacco. She reports that she does not drink alcohol or use drugs.  She is on disability from her DM complications.  She was a substitute Education officer, museum.  She is married x 34 years.  Social History   Social History  . Marital status: Married    Spouse name: N/A  . Number of children: N/A  . Years of education: N/A   Social History Main Topics  . Smoking status: Never Smoker  . Smokeless tobacco: Never Used  .  Alcohol use No  . Drug use: No  . Sexual activity: Not Asked   Other Topics Concern  . None   Social History Narrative  . None    PERFORMANCE STATUS: The patient's performance status is 2 - Symptomatic, <50% confined to bed  PHYSICAL EXAM: Vitals:  Vitals:   09/19/16 1048  BP: 139/64  Pulse: 79  Resp: 18  Temp: 98 F (36.7 C)   Filed Weights   09/19/16 1048  Weight: 255 lb 4.8 oz (115.8 kg)    Patient seen seated in wheelchair.  Physical Exam  Constitutional: She is oriented to person, place, and time and well-developed, well-nourished, and in no distress.  HENT:  Head: Normocephalic.  Mouth/Throat: No oropharyngeal exudate.  Eyes: Conjunctivae and EOM are normal. Pupils are equal, round, and reactive to light. No scleral icterus.  Neck: Normal range of motion. Neck supple.  Cardiovascular: Normal rate and regular rhythm.   Pulmonary/Chest: Effort normal and breath sounds normal.  Abdominal: Soft. Bowel sounds are normal. There is tenderness (RUQ tenderness to palpation).  Musculoskeletal: She exhibits edema (1-2+ pitting edema bilateral LE).  Lymphadenopathy:    She has no cervical adenopathy.  Neurological: She is alert and oriented to person, place, and time.  Skin: Skin is warm and dry. There is pallor.  Psychiatric: Mood, memory and judgment normal.    Mildly flat affect      LABORATORY DATA:  CBC    Component Value Date/Time   WBC 12.8 (H) 09/19/2016 0947   RBC 3.46 (L) 09/19/2016 1143   RBC 3.51 (L) 09/19/2016 0947   HGB 8.6 (L) 09/19/2016 0947   HCT 27.4 (L) 09/19/2016 0947   PLT 573 (H) 09/19/2016 0947   MCV 78.1 09/19/2016 0947   MCH 24.5 (L) 09/19/2016 0947   MCHC 31.4 09/19/2016 0947   RDW 18.4 (H) 09/19/2016 0947   LYMPHSABS 3.1 09/19/2016 0947   MONOABS 0.9 09/19/2016 0947   EOSABS 0.3 09/19/2016 0947   BASOSABS 0.1 09/19/2016 0947   CMP Latest Ref Rng & Units 09/19/2016 08/24/2016 10/28/2015  Glucose 65 - 99 mg/dL 113(H) 324(H) 117(H)  BUN 6 - 20 mg/dL _0 Creatinine 0.44 - 1.00 mg/dL 0.92 1.04(H) 0.97  Sodium 135 - 145 mmol/L 136 132(L) 138  Potassium 3.5 - 5.1 mmol/L 3.9 4.1 3.8  Chloride 101 - 111 mmol/L 99(L) 96(L) 101  CO2 22 - 32 mmol/L _1 Calcium 8.9 - 10.3 mg/dL 8.5(L) 8.9 9.5  Total Protein 6.5 - 8.1 g/dL - 8.0 7.2  Total Bilirubin 0.3 - 1.2 mg/dL - 0.5 0.5  Alkaline Phos 38 - 126 U/L - 94 94  AST 15 - 41 U/L - 12(L) 18  ALT 14 - 54 U/L - 12(L) 16   Results for ROSARIO, DUEY (MRN [id number])   Ref. Range 08/24/2016 12:47  LDH Latest Ref Range: 98 - 192 U/L 140  Iron Latest Ref Range: 28 - 170 ug/dL 14 (L)  UIBC Latest Units: ug/dL 238  TIBC Latest Ref Range: 250 - 450 ug/dL 252  Saturation Ratios Latest Ref Range: 10.4 - 31.8 % 6 (L)  Ferritin Latest Ref Range: 11 - 307 ng/mL 219  CRP Latest Ref Range: <1.0 mg/dL 11.5 (H)   Results for KEISHLA, OYER (MRN [id number])   Ref. Range 08/24/2016 12:47  Haptoglobin Latest Ref Range: 34 - 200 mg/dL 639 (H)  Erythropoietin Latest Ref Range: 2.6 - 18.5 mIU/mL 39.8 (H)  Sed Rate Latest Ref Range: 0 -  22 mm/hr 134 (H)           RADIOGRAPHY: No results found.     PATHOLOGY:  N/A    ASSESSMENT/PLAN:  Ms. Zeiders is 51 y.o. female with recent diagnosis of microcytic, hypochromic anemia. She's here for continued follow-up and establishment  of plan of care.   Iron deficiency anemia: -Her colonoscopy was reportedly negative recently; no polyps, ulcerations, or active bleeding per patient report.  Her serum iron is very low at 14. She is fatigued. We discussed the plan to replete her iron with IV iron infusion.  Her calculated iron deficit is 1,341 (14 - Hgb x 2.145 x weight kg). We discussed possible side effects of IV iron infusions, including anaphylaxis.  She agreed to proceed with IV iron. Feraheme x 2 doses has been ordered. We will make arrangements for her 1st infusion to start sometime next week.     Elevated CRP:  -We discussed that there are several possible etiologies of elevated C-reactive protein including inflammation, infection, autoimmune disorders, malignancy, and others.  Malignancy is unlikely given JAK2 and CALR mutations are negative.  Autoimmune disorder/inflammatory disorder could be etiology of elevated CRP and her anemia.  Therefore, we will order ANA, rheumatoid factor, CCP antibody, ESR, and reticulocyte count.  We will repeat CBC with diff, iron studies, and CMET. We will bring her back in about 4 weeks to meet with Dr. Talbert Cage for further management and discussion.   Right upper quadrant abdominal pain:  -She has reported h/o cholecystectomy.  I ordered right upper quadrant ultrasound to evaluate her tenderness on palpation, however she declined imaging.  I stressed the importance of evaluating her pain, but she continued to decline; she states her reasoning is "I have just had too much done over the past couple of years and I don't want to do it."  Health maintenance/Cancer screening:  -It has been several years since her last mammogram. I offered to order this and get it scheduled for her. She declined and does not want to have a mammogram done. Encourage follow-up with PCP.     Dispo: -Return to cancer center in 4 weeks for follow-up with repeat labs.  -Right upper quadrant abd ultrasound ordered d/t pain  on exam; patient declined imaging.    ORDERS PLACED FOR THIS ENCOUNTER: Orders Placed This Encounter  Procedures  . US Abdomen Limited RUQ  . ANA, IFA (with reflex)  . Rheumatoid factor  . Cyclic Citrul Peptide Antibody, IGG  . Sedimentation rate  . C-reactive protein  . Reticulocytes  . CYCLIC CITRUL PEPTIDE ANTIBODY, IGG/IGA    MEDICATIONS PRESCRIBED THIS ENCOUNTER: No orders of the defined types were placed in this encounter.   All questions were answered. The patient knows to call the clinic with any problems, questions or concerns. We can certainly see the patient much sooner if necessary.  Mike Craze, NP [place] [phone]

## 2016-09-19 NOTE — Patient Instructions (Addendum)
[place] at [facility] Discharge Instructions  RECOMMENDATIONS MADE BY THE CONSULTANT AND ANY TEST RESULTS WILL BE SENT TO YOUR REFERRING PHYSICIAN.  Exam with Mike Craze, NP. Labs today.  Please also return to the lab as you leave for more testing. Return to the clinic in one month to see the doctor for follow up as well as labs that day.   We are going to give you IV iron next week, Please see Amy as you leave today for all appointment dates and times.     Thank you for choosing [place] at [facility] to provide your oncology and hematology care.  To afford each patient quality time with our provider, please arrive at least 15 minutes before your scheduled appointment time.    If you have a lab appointment with the [facility] [facility] please come in thru the  Main Entrance and check in at the main information desk  You need to re-schedule your appointment should you arrive 10 or more minutes late.  We strive to give you quality time with our providers, and arriving late affects you and other patients whose appointments are after yours.  Also, if you no show three or more times for appointments you may be dismissed from the clinic at the providers discretion.     Again, thank you for choosing [facility].  Our hope is that these requests will decrease the amount of time that you wait before being seen by our physicians.       _____________________________________________________________  Should you have questions after your visit to [facility], please contact our office at [phone] between the hours of 8:30 a.m. and 4:30 p.m.  Voicemails left after 4:30 p.m. will not be returned until the following business day.  For prescription refill requests, have your pharmacy contact our office.       Resources For Cancer Patients and their Caregivers ? American Cancer Society: Can assist with transportation, wigs,  general needs, runs Look Good Feel Better.        435-534-5438 ? Cancer Care: Provides financial assistance, online support groups, medication/co-pay assistance.  1-800-813-HOPE (519)234-9686) ? [facility] Assists [place] cancer patients and their families through emotional , educational and financial support.  959-359-0173 ? [place] DSS Where to apply for food stamps, Medicaid and utility assistance. (610)743-1176 ? RCATS: Transportation to medical appointments. 540-139-3871 ? Social Security Administration: May apply for disability if have a Stage IV cancer. 2294403853 [phone] ? [employer], Disability and Transit Services: Assists with nutrition, care and transit needs. [place] Support Programs: @10RELATIVEDAYS @ > Cancer Support Group  2nd Tuesday of the month 1pm-2pm, Journey Room  > Creative Journey  3rd Tuesday of the month 1130am-1pm, Journey Room  > Look Good Feel Better  1st Wednesday of the month 10am-12 noon, Journey Room (Call [facility] to register [phone])

## 2016-09-20 ENCOUNTER — Encounter (HOSPITAL_COMMUNITY): Payer: Self-pay | Admitting: Oncology

## 2016-09-20 LAB — CYCLIC CITRUL PEPTIDE ANTIBODY, IGG/IGA: CCP ANTIBODIES IGG/IGA: 8 U (ref 0–19)

## 2016-09-20 LAB — RHEUMATOID FACTOR: RHEUMATOID FACTOR: 12.4 [IU]/mL (ref 0.0–13.9)

## 2016-09-20 LAB — ANTINUCLEAR ANTIBODIES, IFA: ANTINUCLEAR ANTIBODIES, IFA: NEGATIVE

## 2016-09-28 ENCOUNTER — Encounter (HOSPITAL_COMMUNITY): Payer: Self-pay

## 2016-09-28 ENCOUNTER — Encounter (HOSPITAL_COMMUNITY): Payer: Medicare Other | Attending: Oncology

## 2016-09-28 VITALS — BP 132/60 | HR 73 | Temp 98.3°F | Resp 18

## 2016-09-28 DIAGNOSIS — Z8249 Family history of ischemic heart disease and other diseases of the circulatory system: Secondary | ICD-10-CM | POA: Insufficient documentation

## 2016-09-28 DIAGNOSIS — Z809 Family history of malignant neoplasm, unspecified: Secondary | ICD-10-CM | POA: Insufficient documentation

## 2016-09-28 DIAGNOSIS — E1122 Type 2 diabetes mellitus with diabetic chronic kidney disease: Secondary | ICD-10-CM | POA: Insufficient documentation

## 2016-09-28 DIAGNOSIS — D509 Iron deficiency anemia, unspecified: Secondary | ICD-10-CM | POA: Insufficient documentation

## 2016-09-28 DIAGNOSIS — I129 Hypertensive chronic kidney disease with stage 1 through stage 4 chronic kidney disease, or unspecified chronic kidney disease: Secondary | ICD-10-CM | POA: Insufficient documentation

## 2016-09-28 DIAGNOSIS — Z9049 Acquired absence of other specified parts of digestive tract: Secondary | ICD-10-CM | POA: Insufficient documentation

## 2016-09-28 DIAGNOSIS — R634 Abnormal weight loss: Secondary | ICD-10-CM | POA: Insufficient documentation

## 2016-09-28 DIAGNOSIS — Z794 Long term (current) use of insulin: Secondary | ICD-10-CM | POA: Insufficient documentation

## 2016-09-28 DIAGNOSIS — R5383 Other fatigue: Secondary | ICD-10-CM | POA: Insufficient documentation

## 2016-09-28 DIAGNOSIS — K219 Gastro-esophageal reflux disease without esophagitis: Secondary | ICD-10-CM | POA: Insufficient documentation

## 2016-09-28 DIAGNOSIS — Z888 Allergy status to other drugs, medicaments and biological substances status: Secondary | ICD-10-CM | POA: Insufficient documentation

## 2016-09-28 DIAGNOSIS — Z9889 Other specified postprocedural states: Secondary | ICD-10-CM | POA: Insufficient documentation

## 2016-09-28 DIAGNOSIS — Z7982 Long term (current) use of aspirin: Secondary | ICD-10-CM | POA: Insufficient documentation

## 2016-09-28 DIAGNOSIS — N189 Chronic kidney disease, unspecified: Secondary | ICD-10-CM | POA: Insufficient documentation

## 2016-09-28 DIAGNOSIS — E1140 Type 2 diabetes mellitus with diabetic neuropathy, unspecified: Secondary | ICD-10-CM | POA: Insufficient documentation

## 2016-09-28 DIAGNOSIS — F329 Major depressive disorder, single episode, unspecified: Secondary | ICD-10-CM | POA: Insufficient documentation

## 2016-09-28 DIAGNOSIS — E7800 Pure hypercholesterolemia, unspecified: Secondary | ICD-10-CM | POA: Insufficient documentation

## 2016-09-28 DIAGNOSIS — Z833 Family history of diabetes mellitus: Secondary | ICD-10-CM | POA: Insufficient documentation

## 2016-09-28 DIAGNOSIS — D72829 Elevated white blood cell count, unspecified: Secondary | ICD-10-CM | POA: Insufficient documentation

## 2016-09-28 MED ORDER — SODIUM CHLORIDE 0.9 % IV SOLN
510.0000 mg | Freq: Once | INTRAVENOUS | Status: AC
  Administered 2016-09-28: 510 mg via INTRAVENOUS
  Filled 2016-09-28: qty 17

## 2016-09-28 MED ORDER — SODIUM CHLORIDE 0.9 % IV SOLN
Freq: Once | INTRAVENOUS | Status: AC
  Administered 2016-09-28: 14:00:00 via INTRAVENOUS

## 2016-09-28 NOTE — Patient Instructions (Signed)
[facility] at [facility] Discharge Instructions  RECOMMENDATIONS MADE BY THE CONSULTANT AND ANY TEST RESULTS WILL BE SENT TO YOUR REFERRING PHYSICIAN.  Iron infusion today. Return as scheduled.   Thank you for choosing [facility] at [facility] to provide your oncology and hematology care.  To afford each patient quality time with our provider, please arrive at least 15 minutes before your scheduled appointment time.    If you have a lab appointment with the Cancer Center please come in thru the  Main Entrance and check in at the main information desk  You need to re-schedule your appointment should you arrive 10 or more minutes late.  We strive to give you quality time with our providers, and arriving late affects you and other patients whose appointments are after yours.  Also, if you no show three or more times for appointments you may be dismissed from the clinic at the providers discretion.     Again, thank you for choosing [facility].  Our hope is that these requests will decrease the amount of time that you wait before being seen by our physicians.       _____________________________________________________________  Should you have questions after your visit to [facility], please contact our office at [phone] between the hours of 8:30 a.m. and 4:30 p.m.  Voicemails left after 4:30 p.m. will not be returned until the following business day.  For prescription refill requests, have your pharmacy contact our office.       Resources For Cancer Patients and their Caregivers ? American Cancer Society: Can assist with transportation, wigs, general needs, runs Look Good Feel Better.        1-888-227-6333 ? Cancer Care: Provides financial assistance, online support groups, medication/co-pay assistance.  1-800-813-HOPE (4673) ? [facility] Assists [place] cancer patients and their  families through emotional , educational and financial support.  336-427-4357 ? [place] DSS Where to apply for food stamps, Medicaid and utility assistance. 336-342-1394 ? RCATS: Transportation to medical appointments. 336-347-2287 ? Social Security Administration: May apply for disability if have a Stage IV cancer. 336-342-7796 1-800-772-1213 ? [place] Aging, Disability and Transit Services: Assists with nutrition, care and transit needs. 336-349-2343  Cancer Center Support Programs: @10RELATIVEDAYS@ > Cancer Support Group  2nd Tuesday of the month 1pm-2pm, Journey Room  > Creative Journey  3rd Tuesday of the month 1130am-1pm, Journey Room  > Look Good Feel Better  1st Wednesday of the month 10am-12 noon, Journey Room (Call American Cancer [employer] to register 1-800-395-5775)   

## 2016-09-28 NOTE — Progress Notes (Signed)
Tolerated infusion w/o adverse reaction.  Alert, in no distress.  VSS.  Discharged via wheelchair in c/o spouse.  

## 2016-10-05 ENCOUNTER — Encounter (HOSPITAL_BASED_OUTPATIENT_CLINIC_OR_DEPARTMENT_OTHER): Payer: Medicare Other

## 2016-10-05 ENCOUNTER — Encounter (HOSPITAL_COMMUNITY): Payer: Self-pay

## 2016-10-05 VITALS — BP 130/60 | HR 75 | Temp 98.6°F | Resp 18

## 2016-10-05 DIAGNOSIS — D509 Iron deficiency anemia, unspecified: Secondary | ICD-10-CM | POA: Diagnosis not present

## 2016-10-05 MED ORDER — SODIUM CHLORIDE 0.9 % IV SOLN
Freq: Once | INTRAVENOUS | Status: AC
  Administered 2016-10-05: 14:00:00 via INTRAVENOUS

## 2016-10-05 MED ORDER — SODIUM CHLORIDE 0.9 % IV SOLN
510.0000 mg | Freq: Once | INTRAVENOUS | Status: AC
  Administered 2016-10-05: 510 mg via INTRAVENOUS
  Filled 2016-10-05: qty 17

## 2016-10-05 NOTE — Progress Notes (Signed)
Feraheme given per orders. Patient tolerated it well. Vitals stable and discharged home from clinic via wheelchair with husband.follow up as scheduled.

## 2016-10-05 NOTE — Patient Instructions (Signed)
[facility] at [facility] Discharge Instructions  RECOMMENDATIONS MADE BY THE CONSULTANT AND ANY TEST RESULTS WILL BE SENT TO YOUR REFERRING PHYSICIAN.  feraheme given today Follow up as scheduled  Thank you for choosing [facility] at [facility] to provide your oncology and hematology care.  To afford each patient quality time with our provider, please arrive at least 15 minutes before your scheduled appointment time.    If you have a lab appointment with the Cancer Center please come in thru the  Main Entrance and check in at the main information desk  You need to re-schedule your appointment should you arrive 10 or more minutes late.  We strive to give you quality time with our providers, and arriving late affects you and other patients whose appointments are after yours.  Also, if you no show three or more times for appointments you may be dismissed from the clinic at the providers discretion.     Again, thank you for choosing [facility].  Our hope is that these requests will decrease the amount of time that you wait before being seen by our physicians.       _____________________________________________________________  Should you have questions after your visit to [facility], please contact our office at [phone] between the hours of 8:30 a.m. and 4:30 p.m.  Voicemails left after 4:30 p.m. will not be returned until the following business day.  For prescription refill requests, have your pharmacy contact our office.       Resources For Cancer Patients and their Caregivers ? American Cancer Society: Can assist with transportation, wigs, general needs, runs Look Good Feel Better.        1-888-227-6333 ? Cancer Care: Provides financial assistance, online support groups, medication/co-pay assistance.  1-800-813-HOPE (4673) ? [facility] Assists [place] cancer patients and  their families through emotional , educational and financial support.  336-427-4357 ? [place] DSS Where to apply for food stamps, Medicaid and utility assistance. 336-342-1394 ? RCATS: Transportation to medical appointments. 336-347-2287 ? Social Security Administration: May apply for disability if have a Stage IV cancer. 336-342-7796 1-800-772-1213 ? [place] Aging, Disability and Transit Services: Assists with nutrition, care and transit needs. 336-349-2343  Cancer Center Support Programs: @10RELATIVEDAYS@ > Cancer Support Group  2nd Tuesday of the month 1pm-2pm, Journey Room  > Creative Journey  3rd Tuesday of the month 1130am-1pm, Journey Room  > Look Good Feel Better  1st Wednesday of the month 10am-12 noon, Journey Room (Call American Cancer Society to register 1-800-395-5775)   

## 2016-10-11 ENCOUNTER — Other Ambulatory Visit (HOSPITAL_COMMUNITY): Payer: Self-pay | Admitting: *Deleted

## 2016-10-11 DIAGNOSIS — D509 Iron deficiency anemia, unspecified: Secondary | ICD-10-CM

## 2016-10-14 ENCOUNTER — Encounter (HOSPITAL_COMMUNITY): Payer: Medicare Other

## 2016-10-14 DIAGNOSIS — Z794 Long term (current) use of insulin: Secondary | ICD-10-CM | POA: Diagnosis not present

## 2016-10-14 DIAGNOSIS — N189 Chronic kidney disease, unspecified: Secondary | ICD-10-CM | POA: Diagnosis not present

## 2016-10-14 DIAGNOSIS — E1122 Type 2 diabetes mellitus with diabetic chronic kidney disease: Secondary | ICD-10-CM | POA: Diagnosis not present

## 2016-10-14 DIAGNOSIS — I129 Hypertensive chronic kidney disease with stage 1 through stage 4 chronic kidney disease, or unspecified chronic kidney disease: Secondary | ICD-10-CM | POA: Diagnosis not present

## 2016-10-14 DIAGNOSIS — E7800 Pure hypercholesterolemia, unspecified: Secondary | ICD-10-CM | POA: Diagnosis not present

## 2016-10-14 DIAGNOSIS — Z7982 Long term (current) use of aspirin: Secondary | ICD-10-CM | POA: Diagnosis not present

## 2016-10-14 DIAGNOSIS — K219 Gastro-esophageal reflux disease without esophagitis: Secondary | ICD-10-CM | POA: Diagnosis not present

## 2016-10-14 DIAGNOSIS — D509 Iron deficiency anemia, unspecified: Secondary | ICD-10-CM

## 2016-10-14 DIAGNOSIS — Z9889 Other specified postprocedural states: Secondary | ICD-10-CM | POA: Diagnosis not present

## 2016-10-14 DIAGNOSIS — R5383 Other fatigue: Secondary | ICD-10-CM | POA: Diagnosis not present

## 2016-10-14 DIAGNOSIS — Z888 Allergy status to other drugs, medicaments and biological substances status: Secondary | ICD-10-CM | POA: Diagnosis not present

## 2016-10-14 DIAGNOSIS — R634 Abnormal weight loss: Secondary | ICD-10-CM | POA: Diagnosis not present

## 2016-10-14 DIAGNOSIS — Z9049 Acquired absence of other specified parts of digestive tract: Secondary | ICD-10-CM | POA: Diagnosis not present

## 2016-10-14 DIAGNOSIS — Z833 Family history of diabetes mellitus: Secondary | ICD-10-CM | POA: Diagnosis not present

## 2016-10-14 DIAGNOSIS — F329 Major depressive disorder, single episode, unspecified: Secondary | ICD-10-CM | POA: Diagnosis not present

## 2016-10-14 DIAGNOSIS — D72829 Elevated white blood cell count, unspecified: Secondary | ICD-10-CM | POA: Diagnosis not present

## 2016-10-14 DIAGNOSIS — E1140 Type 2 diabetes mellitus with diabetic neuropathy, unspecified: Secondary | ICD-10-CM | POA: Diagnosis not present

## 2016-10-14 DIAGNOSIS — Z8249 Family history of ischemic heart disease and other diseases of the circulatory system: Secondary | ICD-10-CM | POA: Diagnosis not present

## 2016-10-14 DIAGNOSIS — Z809 Family history of malignant neoplasm, unspecified: Secondary | ICD-10-CM | POA: Diagnosis not present

## 2016-10-14 LAB — COMPREHENSIVE METABOLIC PANEL
ALBUMIN: 2.7 g/dL — AB (ref 3.5–5.0)
ALT: 12 U/L — ABNORMAL LOW (ref 14–54)
ANION GAP: 7 (ref 5–15)
AST: 15 U/L (ref 15–41)
Alkaline Phosphatase: 86 U/L (ref 38–126)
BILIRUBIN TOTAL: 0.2 mg/dL — AB (ref 0.3–1.2)
BUN: 21 mg/dL — ABNORMAL HIGH (ref 6–20)
CHLORIDE: 101 mmol/L (ref 101–111)
CO2: 28 mmol/L (ref 22–32)
Calcium: 8.9 mg/dL (ref 8.9–10.3)
Creatinine, Ser: 0.82 mg/dL (ref 0.44–1.00)
GFR calc Af Amer: >60 mL/min (ref >60)
Glucose, Bld: 194 mg/dL — ABNORMAL HIGH (ref 65–99)
POTASSIUM: 4.6 mmol/L (ref 3.5–5.1)
Sodium: 136 mmol/L (ref 135–145)
TOTAL PROTEIN: 7.8 g/dL (ref 6.5–8.1)

## 2016-10-14 LAB — CBC WITH DIFFERENTIAL/PLATELET
BASOS ABS: 0.1 10*3/uL (ref 0.0–0.1)
BASOS PCT: 1 %
Eosinophils Absolute: 0.2 10*3/uL (ref 0.0–0.7)
Eosinophils Relative: 2 %
HEMATOCRIT: 29.9 % — AB (ref 36.0–46.0)
Hemoglobin: 9.2 g/dL — ABNORMAL LOW (ref 12.0–15.0)
Lymphocytes Relative: 22 %
Lymphs Abs: 2.3 10*3/uL (ref 0.7–4.0)
MCH: 25.1 pg — ABNORMAL LOW (ref 26.0–34.0)
MCHC: 30.8 g/dL (ref 30.0–36.0)
MCV: 81.5 fL (ref 78.0–100.0)
MONO ABS: 0.5 10*3/uL (ref 0.1–1.0)
MONOS PCT: 5 %
NEUTROS ABS: 7.5 10*3/uL (ref 1.7–7.7)
Neutrophils Relative %: 70 %
PLATELETS: 522 10*3/uL — AB (ref 150–400)
RBC: 3.67 MIL/uL — ABNORMAL LOW (ref 3.87–5.11)
RDW: 20.6 % — AB (ref 11.5–15.5)
WBC: 10.7 10*3/uL — ABNORMAL HIGH (ref 4.0–10.5)

## 2016-10-14 LAB — IRON AND TIBC
IRON: 31 ug/dL (ref 28–170)
Saturation Ratios: 13 % (ref 10.4–31.8)
TIBC: 231 ug/dL — ABNORMAL LOW (ref 250–450)
UIBC: 200 ug/dL

## 2016-10-14 LAB — FERRITIN: FERRITIN: 811 ng/mL — AB (ref 11–307)

## 2016-10-20 ENCOUNTER — Encounter (HOSPITAL_COMMUNITY): Payer: Self-pay

## 2016-10-20 ENCOUNTER — Encounter (HOSPITAL_COMMUNITY): Payer: Medicare Other | Attending: Oncology | Admitting: Oncology

## 2016-10-20 VITALS — BP 155/65 | HR 82 | Temp 98.1°F | Resp 18 | Wt 242.0 lb

## 2016-10-20 DIAGNOSIS — F329 Major depressive disorder, single episode, unspecified: Secondary | ICD-10-CM | POA: Insufficient documentation

## 2016-10-20 DIAGNOSIS — E7800 Pure hypercholesterolemia, unspecified: Secondary | ICD-10-CM | POA: Insufficient documentation

## 2016-10-20 DIAGNOSIS — R5383 Other fatigue: Secondary | ICD-10-CM | POA: Insufficient documentation

## 2016-10-20 DIAGNOSIS — Z9049 Acquired absence of other specified parts of digestive tract: Secondary | ICD-10-CM | POA: Insufficient documentation

## 2016-10-20 DIAGNOSIS — E1140 Type 2 diabetes mellitus with diabetic neuropathy, unspecified: Secondary | ICD-10-CM | POA: Insufficient documentation

## 2016-10-20 DIAGNOSIS — D72829 Elevated white blood cell count, unspecified: Secondary | ICD-10-CM | POA: Insufficient documentation

## 2016-10-20 DIAGNOSIS — Z794 Long term (current) use of insulin: Secondary | ICD-10-CM | POA: Insufficient documentation

## 2016-10-20 DIAGNOSIS — Z7982 Long term (current) use of aspirin: Secondary | ICD-10-CM | POA: Insufficient documentation

## 2016-10-20 DIAGNOSIS — R634 Abnormal weight loss: Secondary | ICD-10-CM | POA: Insufficient documentation

## 2016-10-20 DIAGNOSIS — I129 Hypertensive chronic kidney disease with stage 1 through stage 4 chronic kidney disease, or unspecified chronic kidney disease: Secondary | ICD-10-CM | POA: Insufficient documentation

## 2016-10-20 DIAGNOSIS — K219 Gastro-esophageal reflux disease without esophagitis: Secondary | ICD-10-CM | POA: Insufficient documentation

## 2016-10-20 DIAGNOSIS — Z888 Allergy status to other drugs, medicaments and biological substances status: Secondary | ICD-10-CM | POA: Insufficient documentation

## 2016-10-20 DIAGNOSIS — Z833 Family history of diabetes mellitus: Secondary | ICD-10-CM | POA: Insufficient documentation

## 2016-10-20 DIAGNOSIS — D509 Iron deficiency anemia, unspecified: Secondary | ICD-10-CM | POA: Insufficient documentation

## 2016-10-20 DIAGNOSIS — N189 Chronic kidney disease, unspecified: Secondary | ICD-10-CM | POA: Insufficient documentation

## 2016-10-20 DIAGNOSIS — Z9889 Other specified postprocedural states: Secondary | ICD-10-CM | POA: Insufficient documentation

## 2016-10-20 DIAGNOSIS — Z8249 Family history of ischemic heart disease and other diseases of the circulatory system: Secondary | ICD-10-CM | POA: Insufficient documentation

## 2016-10-20 DIAGNOSIS — Z809 Family history of malignant neoplasm, unspecified: Secondary | ICD-10-CM | POA: Insufficient documentation

## 2016-10-20 DIAGNOSIS — E1122 Type 2 diabetes mellitus with diabetic chronic kidney disease: Secondary | ICD-10-CM | POA: Insufficient documentation

## 2016-10-20 NOTE — Progress Notes (Signed)
[place]:  Medical Oncology/Hematology Progress Note  REASON FOR VISIT:  Follow-up for microcytic, hypochromic anemia (likely iron deficiency anemia)  REFERRING PHYSICIAN:   Rory Percy, MD (Primary Care Provider)  CURRENT THERAPY: Oncology Flowsheet 09/28/2016 10/05/2016  ferumoxytol (FERAHEME) IV 510 mg 510 mg    HISTORY OF PRESENT ILLNESS:   Katherine Peterson is a 51 y.o. female with a medical history significant for type II DM insulin dependent-poorly controlled, depression, arthopathy who is referred to the [facility] for anemia evaluation.    INTERVAL HISTORY:   She presents to the clinic today accompanied by her husband for continued follow up. She presents in a wheelchair. I personally reviewed and went over labs with the patient.  She states she feels a little bit more energetic since she received her IV iron.   No new complaints/concerns since her last visit.  REVIEW OF SYSTEMS:  Review of Systems  Constitutional: Negative.  Negative for malaise/fatigue (feels a little bit more energetic since she received iron).  HENT: Negative.   Eyes: Negative.   Respiratory: Negative.  Negative for hemoptysis.   Cardiovascular: Positive for leg swelling (right leg swollen).  Gastrointestinal: Positive for abdominal pain (chronic right side abdominal pain). Negative for blood in stool and melena.  Genitourinary: Negative.  Negative for hematuria.       Denies vaginal bleeding  Musculoskeletal: Positive for back pain (chronic back pain).  Skin: Negative.   Neurological: Negative.   Endo/Heme/Allergies: Negative.   Psychiatric/Behavioral: Negative.   All other systems reviewed and are negative.   PAST MEDICAL HISTORY:   Past Medical History:  Diagnosis Date  . Anemia   . Arthritis   . Cataract    left eye  . Chronic kidney disease    stage three-   . Constipation   . Depression   . Diabetes mellitus without complication ([place])    . Diabetic neuropathy ([place])   . Diverticulosis   . Foot fracture, right    dislocation  . GERD (gastroesophageal reflux disease)   . Head injury, closed, with brief LOC ([place]) 1998   low blood sugar- "passed out"  . 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 HOH (hard of hearing)   . Hypercholesterolemia   . Hypertension   . Leukocytosis 08/24/2016  . Microcytic hypochromic anemia 08/24/2016  . Neuropathy ([place])   . Pneumonia   . Shortness of breath dyspnea    short of breath - at times when I get out of bed at night    ALLERGIES: Allergies  Allergen Reactions  . Codeine Hives  . Keflex [Cephalexin] Other (See Comments)    Sick to stomach      MEDICATIONS: I have reviewed the patient's current medications.    Current Outpatient Prescriptions on File Prior to Visit  Medication Sig Dispense Refill  . acetaminophen (TYLENOL) 500 MG tablet Take 1,000 mg by mouth every 6 (six) hours as needed for moderate pain.    Marland Kitchen albuterol (PROVENTIL HFA;VENTOLIN HFA) 108 (90 Base) MCG/ACT inhaler Inhale 2 puffs into the lungs every 4 (four) hours as needed for wheezing or shortness of breath.    Marland Kitchen aspirin EC 81 MG tablet Take 81 mg by mouth daily.    . clonazePAM (KLONOPIN) 0.5 MG tablet Take 0.5 mg by mouth daily as needed for anxiety.    Marland Kitchen escitalopram (LEXAPRO) 10 MG tablet Take 10 mg by  mouth daily.  2  . insulin NPH Human (HUMULIN N,NOVOLIN N) 100 UNIT/ML injection Inject 50-80 Units into the skin 2 (two) times daily. Inject 50 units subque in the morning and 80 units subque in the evening    . losartan-hydrochlorothiazide (HYZAAR) 100-25 MG tablet Take 1 tablet by mouth daily.    . metFORMIN (GLUCOPHAGE-XR) 500 MG 24 hr tablet Take 500 mg by mouth daily with breakfast.    . metoprolol succinate (TOPROL-XL) 50 MG 24 hr tablet Take 50 mg by mouth daily. Take with or immediately following a meal.    . omeprazole (PRILOSEC OTC) 20 MG tablet Take 20 mg by mouth daily.    . pravastatin  (PRAVACHOL) 40 MG tablet Take 40 mg by mouth daily.     No current facility-administered medications on file prior to visit.      PAST SURGICAL HISTORY Past Surgical History:  Procedure Laterality Date  . ABDOMINAL HYSTERECTOMY    . amputation left foot    . CHOLECYSTECTOMY    . DIAGNOSTIC LAPAROSCOPY     small portion of colon  . DILATION AND CURETTAGE OF UTERUS    . EYE SURGERY Right    "for bleeding"  . I&D EXTREMITY Right 10/28/2015   Procedure: Right Foot Excision Medial Cuneiform, Wound Debridement, Place Wound VAC and Antibiotic Beads;  Surgeon: Newt Minion, MD;  Location: [place];  Service: Orthopedics;  Laterality: Right;  . OPEN REDUCTION INTERNAL FIXATION (ORIF) FOOT LISFRANC FRACTURE Right 07/01/2015   Procedure: OPEN REDUCTION INTERNAL FIXATION (ORIF) RIGHT FOOT LISFRANC FRACTURE/DISLOCATION;  Surgeon: Newt Minion, MD;  Location: [place];  Service: Orthopedics;  Laterality: Right;  . SHOULDER SURGERY Left    I and D  . SKIN GRAFT     left  . TUBAL LIGATION      FAMILY HISTORY: Family History  Problem Relation Age of Onset  . Transient ischemic attack Mother   . Diabetes Mother   . Heart disease Father   . Cancer Sister     SOCIAL HISTORY:  reports that she has never smoked. She has never used smokeless tobacco. She reports that she does not drink alcohol or use drugs.  She is on disability from her DM complications.  She was a substitute Education officer, museum.  She is married x 34 years.  Social History   Social History  . Marital status: Married    Spouse name: N/A  . Number of children: N/A  . Years of education: N/A   Social History Main Topics  . Smoking status: Never Smoker  . Smokeless tobacco: Never Used  . Alcohol use No  . Drug use: No  . Sexual activity: Not Asked   Other Topics Concern  . None   Social History Narrative  . None    PERFORMANCE STATUS: The patient's performance status is 2 - Symptomatic, <50% confined to bed  PHYSICAL  EXAM: Vitals:  Vitals:   10/20/16 1434  BP: (!) 155/65  Pulse: 82  Resp: 18  Temp: 98.1 F (36.7 C)   Filed Weights   10/20/16 1434  Weight: 242 lb (109.8 kg)    Physical Exam  Constitutional: She is oriented to person, place, and time and well-developed, well-nourished, and in no distress.  Physical exam was performed in a wheelchair.  HENT:  Head: Normocephalic and atraumatic.  Mouth/Throat: No oropharyngeal exudate.  Eyes: Conjunctivae and EOM are normal. Pupils are equal, round, and reactive to light. No scleral icterus.  Neck: Normal  range of motion. Neck supple.  Cardiovascular: Normal rate, regular rhythm and normal heart sounds.   Pulmonary/Chest: Effort normal and breath sounds normal.  Abdominal: Soft. Bowel sounds are normal.  Musculoskeletal: Normal range of motion.  Lymphadenopathy:    She has no cervical adenopathy.  Neurological: She is alert and oriented to person, place, and time. Gait normal.  Skin: Skin is warm and dry.  Psychiatric: Mood, memory and judgment normal.  Nursing note and vitals reviewed.    LABORATORY DATA:  CBC    Component Value Date/Time   WBC 10.7 (H) 10/14/2016 1155   RBC 3.67 (L) 10/14/2016 1155   HGB 9.2 (L) 10/14/2016 1155   HCT 29.9 (L) 10/14/2016 1155   PLT 522 (H) 10/14/2016 1155   MCV 81.5 10/14/2016 1155   MCH 25.1 (L) 10/14/2016 1155   MCHC 30.8 10/14/2016 1155   RDW 20.6 (H) 10/14/2016 1155   LYMPHSABS 2.3 10/14/2016 1155   MONOABS 0.5 10/14/2016 1155   EOSABS 0.2 10/14/2016 1155   BASOSABS 0.1 10/14/2016 1155   CMP Latest Ref Rng & Units 10/14/2016 09/19/2016 08/24/2016  Glucose 65 - 99 mg/dL 194(H) 113(H) 324(H)  BUN 6 - 20 mg/dL 21(H) 17 17  Creatinine 0.44 - 1.00 mg/dL 0.82 0.92 1.04(H)  Sodium 135 - 145 mmol/L 136 136 132(L)  Potassium 3.5 - 5.1 mmol/L 4.6 3.9 4.1  Chloride 101 - 111 mmol/L 101 99(L) 96(L)  CO2 22 - 32 mmol/L 28 27 26   Calcium 8.9 - 10.3 mg/dL 8.9 8.5(L) 8.9  Total Protein 6.5 - 8.1 g/dL  7.8 - 8.0  Total Bilirubin 0.3 - 1.2 mg/dL 0.2(L) - 0.5  Alkaline Phos 38 - 126 U/L 86 - 94  AST 15 - 41 U/L 15 - 12(L)  ALT 14 - 54 U/L 12(L) - 12(L)   Results for FARREL, SISEMORE (MRN [id number]) as of 10/20/2016 10:43  Ref. Range 09/19/2016 11:43 09/19/2016 12:08 10/14/2016 11:55 10/14/2016 11:56  Iron Latest Ref Range: 28 - 170 ug/dL    31  UIBC Latest Units: ug/dL    200  TIBC Latest Ref Range: 250 - 450 ug/dL    231 (L)  Saturation Ratios Latest Ref Range: 10.4 - 31.8 %    13  Ferritin Latest Ref Range: 11 - 307 ng/mL    811 (H)  CRP Latest Ref Range: <1.0 mg/dL 9.2 (H)      Results for JALEASA, EBERHARD (MRN [id number]) as of 10/20/2016 10:43  Ref. Range 08/24/2016 12:47  LDH Latest Ref Range: 98 - 192 U/L 140    Results for ADLYNN, LARIOS (MRN [id number])   Ref. Range 08/24/2016 12:47  Haptoglobin Latest Ref Range: 34 - 200 mg/dL 639 (H)  Erythropoietin Latest Ref Range: 2.6 - 18.5 mIU/mL 39.8 (H)  Sed Rate Latest Ref Range: 0 - 22 mm/hr 134 (H)           RADIOGRAPHY: I have personally reviewed the radiological images as listed and agreed with the findings in the report. No results found.     XR Ankle 2 Views Right 06/30/2016  Two-view radiographs of the right foot shows stable internal fixation for  Charcot arthropathy there is no loss of reduction no failure of the  internal fixation. Two-view radiographs of the right ankle shows a  congruent mortise no osteochondral defect Charcot arthropathy of the ankle no widening of the mortise.   PATHOLOGY:     ASSESSMENT/PLAN:  Ms. Manka is 51 y.o. female with recent diagnosis of microcytic, hypochromic  anemia.   Her serum iron is very low at 14. Her calculated iron deficit is 1,341 (14 - Hgb x 2.145 x weight kg).  Oncology Flowsheet 09/28/2016 10/05/2016  ferumoxytol (FERAHEME) IV 510 mg 510 mg   Plan: Patient is doing well. Just received iron 2 weeks ago, it will takes a few weeks for her body to use the iron to make more RBCs.   Ferritin is now up to 871 and serum iron is 32. Chronic inflammation markers with CRP/sed rate are elevated, likely due to chronic inflammatory disorder such as her CKD stage III.   Dispo: RTC in 3 months for follow up with repeat CBC, CMP, iron studies.   All questions were answered. The patient knows to call the clinic with any problems, questions or concerns. We can certainly see the patient much sooner if necessary.  This document serves as a record of services personally performed by Twana First, MD. It was created on her behalf by Shirlean Mylar, a trained medical scribe. The creation of this record is based on the scribe's personal observations and the provider's statements to them. This document has been checked and approved by the attending provider.  I have reviewed the above documentation for accuracy and completeness and I agree with the above.

## 2016-10-20 NOTE — Patient Instructions (Signed)
[facility] at [facility] Discharge Instructions  RECOMMENDATIONS MADE BY THE CONSULTANT AND ANY TEST RESULTS WILL BE SENT TO YOUR REFERRING PHYSICIAN.  You were seen today by Dr. Louise Zhou Follow up in 3 months with lab work See Amy up front for appointments   Thank you for choosing [facility] at [facility] to provide your oncology and hematology care.  To afford each patient quality time with our provider, please arrive at least 15 minutes before your scheduled appointment time.    If you have a lab appointment with the Cancer Center please come in thru the  Main Entrance and check in at the main information desk  You need to re-schedule your appointment should you arrive 10 or more minutes late.  We strive to give you quality time with our providers, and arriving late affects you and other patients whose appointments are after yours.  Also, if you no show three or more times for appointments you may be dismissed from the clinic at the providers discretion.     Again, thank you for choosing [facility].  Our hope is that these requests will decrease the amount of time that you wait before being seen by our physicians.       _____________________________________________________________  Should you have questions after your visit to [facility], please contact our office at [phone] between the hours of 8:30 a.m. and 4:30 p.m.  Voicemails left after 4:30 p.m. will not be returned until the following business day.  For prescription refill requests, have your pharmacy contact our office.       Resources For Cancer Patients and their Caregivers ? American Cancer Society: Can assist with transportation, wigs, general needs, runs Look Good Feel Better.        1-888-227-6333 ? Cancer Care: Provides financial assistance, online support groups, medication/co-pay assistance.  1-800-813-HOPE (4673) ? [facility] Assists [place] cancer patients and their families through emotional , educational and financial support.  336-427-4357 ? [place] DSS Where to apply for food stamps, Medicaid and utility assistance. 336-342-1394 ? [facility]: Transportation to medical appointments. 336-347-2287 ? Social Security Administration: May apply for disability if have a Stage IV cancer. 336-342-7796 1-800-772-1213 ? [place] Aging, Disability and Transit Services: Assists with nutrition, care and transit needs. 336-349-2343  Cancer Center Support Programs: @10RELATIVEDAYS@ > Cancer Support Group  2nd Tuesday of the month 1pm-2pm, Journey Room  > Creative Journey  3rd Tuesday of the month 1130am-1pm, Journey Room  > Look Good Feel Better  1st Wednesday of the month 10am-12 noon, Journey Room (Call American Cancer Society to register 1-800-395-5775)    

## 2016-11-01 DIAGNOSIS — E1142 Type 2 diabetes mellitus with diabetic polyneuropathy: Secondary | ICD-10-CM | POA: Diagnosis not present

## 2016-11-01 DIAGNOSIS — L97321 Non-pressure chronic ulcer of left ankle limited to breakdown of skin: Secondary | ICD-10-CM | POA: Diagnosis not present

## 2016-11-29 DIAGNOSIS — L97422 Non-pressure chronic ulcer of left heel and midfoot with fat layer exposed: Secondary | ICD-10-CM | POA: Diagnosis not present

## 2016-12-20 DIAGNOSIS — L97422 Non-pressure chronic ulcer of left heel and midfoot with fat layer exposed: Secondary | ICD-10-CM | POA: Diagnosis not present

## 2017-01-10 DIAGNOSIS — L97422 Non-pressure chronic ulcer of left heel and midfoot with fat layer exposed: Secondary | ICD-10-CM | POA: Diagnosis not present

## 2017-01-19 DIAGNOSIS — D649 Anemia, unspecified: Secondary | ICD-10-CM | POA: Diagnosis not present

## 2017-01-19 DIAGNOSIS — E7800 Pure hypercholesterolemia, unspecified: Secondary | ICD-10-CM | POA: Diagnosis not present

## 2017-01-19 DIAGNOSIS — E1165 Type 2 diabetes mellitus with hyperglycemia: Secondary | ICD-10-CM | POA: Diagnosis not present

## 2017-01-19 DIAGNOSIS — E11610 Type 2 diabetes mellitus with diabetic neuropathic arthropathy: Secondary | ICD-10-CM | POA: Diagnosis not present

## 2017-01-20 ENCOUNTER — Ambulatory Visit (HOSPITAL_COMMUNITY): Payer: Medicare Other

## 2017-01-20 ENCOUNTER — Other Ambulatory Visit: Payer: Self-pay | Admitting: Internal Medicine

## 2017-01-20 ENCOUNTER — Other Ambulatory Visit (HOSPITAL_BASED_OUTPATIENT_CLINIC_OR_DEPARTMENT_OTHER): Payer: Self-pay | Admitting: Internal Medicine

## 2017-01-20 ENCOUNTER — Ambulatory Visit (HOSPITAL_COMMUNITY)
Admission: RE | Admit: 2017-01-20 | Discharge: 2017-01-20 | Disposition: A | Discharge status: Home / Self Care | Payer: Medicare Other | Source: Ambulatory Visit | Attending: Internal Medicine | Admitting: Internal Medicine

## 2017-01-20 ENCOUNTER — Other Ambulatory Visit (HOSPITAL_COMMUNITY)
Admission: RE | Admit: 2017-01-20 | Discharge: 2017-01-20 | Disposition: A | Discharge status: Home / Self Care | Payer: Medicare Other | Source: Other Acute Inpatient Hospital | Attending: Internal Medicine | Admitting: Internal Medicine

## 2017-01-20 ENCOUNTER — Encounter (HOSPITAL_BASED_OUTPATIENT_CLINIC_OR_DEPARTMENT_OTHER): Payer: Medicare Other | Attending: Internal Medicine

## 2017-01-20 ENCOUNTER — Other Ambulatory Visit (HOSPITAL_COMMUNITY): Payer: Medicare Other

## 2017-01-20 DIAGNOSIS — Z794 Long term (current) use of insulin: Secondary | ICD-10-CM | POA: Insufficient documentation

## 2017-01-20 DIAGNOSIS — Z89432 Acquired absence of left foot: Secondary | ICD-10-CM | POA: Diagnosis not present

## 2017-01-20 DIAGNOSIS — N183 Chronic kidney disease, stage 3 (moderate): Secondary | ICD-10-CM | POA: Diagnosis not present

## 2017-01-20 DIAGNOSIS — E1140 Type 2 diabetes mellitus with diabetic neuropathy, unspecified: Secondary | ICD-10-CM | POA: Diagnosis not present

## 2017-01-20 DIAGNOSIS — I129 Hypertensive chronic kidney disease with stage 1 through stage 4 chronic kidney disease, or unspecified chronic kidney disease: Secondary | ICD-10-CM | POA: Diagnosis not present

## 2017-01-20 DIAGNOSIS — L97329 Non-pressure chronic ulcer of left ankle with unspecified severity: Secondary | ICD-10-CM

## 2017-01-20 DIAGNOSIS — E11622 Type 2 diabetes mellitus with other skin ulcer: Secondary | ICD-10-CM | POA: Insufficient documentation

## 2017-01-20 DIAGNOSIS — E1122 Type 2 diabetes mellitus with diabetic chronic kidney disease: Secondary | ICD-10-CM | POA: Diagnosis not present

## 2017-01-20 DIAGNOSIS — I10 Essential (primary) hypertension: Secondary | ICD-10-CM | POA: Diagnosis not present

## 2017-01-20 DIAGNOSIS — M7989 Other specified soft tissue disorders: Secondary | ICD-10-CM | POA: Insufficient documentation

## 2017-01-20 DIAGNOSIS — Z79899 Other long term (current) drug therapy: Secondary | ICD-10-CM | POA: Diagnosis not present

## 2017-01-20 DIAGNOSIS — E11621 Type 2 diabetes mellitus with foot ulcer: Secondary | ICD-10-CM | POA: Diagnosis present

## 2017-01-20 DIAGNOSIS — L97322 Non-pressure chronic ulcer of left ankle with fat layer exposed: Secondary | ICD-10-CM | POA: Insufficient documentation

## 2017-01-20 DIAGNOSIS — L97309 Non-pressure chronic ulcer of unspecified ankle with unspecified severity: Secondary | ICD-10-CM | POA: Diagnosis not present

## 2017-01-23 DIAGNOSIS — E1165 Type 2 diabetes mellitus with hyperglycemia: Secondary | ICD-10-CM | POA: Diagnosis not present

## 2017-01-23 DIAGNOSIS — E11610 Type 2 diabetes mellitus with diabetic neuropathic arthropathy: Secondary | ICD-10-CM | POA: Diagnosis not present

## 2017-01-23 DIAGNOSIS — E7800 Pure hypercholesterolemia, unspecified: Secondary | ICD-10-CM | POA: Diagnosis not present

## 2017-01-23 DIAGNOSIS — Z0001 Encounter for general adult medical examination with abnormal findings: Secondary | ICD-10-CM | POA: Diagnosis not present

## 2017-01-23 LAB — AEROBIC CULTURE  (SUPERFICIAL SPECIMEN): CULTURE: NORMAL

## 2017-01-26 ENCOUNTER — Encounter (HOSPITAL_BASED_OUTPATIENT_CLINIC_OR_DEPARTMENT_OTHER): Payer: Medicare Other

## 2017-01-27 DIAGNOSIS — L97325 Non-pressure chronic ulcer of left ankle with muscle involvement without evidence of necrosis: Secondary | ICD-10-CM | POA: Diagnosis not present

## 2017-01-27 DIAGNOSIS — E11622 Type 2 diabetes mellitus with other skin ulcer: Secondary | ICD-10-CM | POA: Diagnosis not present

## 2017-02-06 DIAGNOSIS — L97322 Non-pressure chronic ulcer of left ankle with fat layer exposed: Secondary | ICD-10-CM | POA: Diagnosis not present

## 2017-02-06 DIAGNOSIS — E11622 Type 2 diabetes mellitus with other skin ulcer: Secondary | ICD-10-CM | POA: Diagnosis not present

## 2017-02-09 ENCOUNTER — Other Ambulatory Visit: Payer: Self-pay | Admitting: Internal Medicine

## 2017-02-09 DIAGNOSIS — E11610 Type 2 diabetes mellitus with diabetic neuropathic arthropathy: Secondary | ICD-10-CM

## 2017-02-17 ENCOUNTER — Inpatient Hospital Stay (HOSPITAL_COMMUNITY): Admission: RE | Admit: 2017-02-17 | Payer: Medicare Other | Source: Ambulatory Visit

## 2017-02-17 ENCOUNTER — Other Ambulatory Visit: Payer: Self-pay | Admitting: Internal Medicine

## 2017-02-17 DIAGNOSIS — E11622 Type 2 diabetes mellitus with other skin ulcer: Secondary | ICD-10-CM | POA: Diagnosis not present

## 2017-02-17 DIAGNOSIS — L98499 Non-pressure chronic ulcer of skin of other sites with unspecified severity: Principal | ICD-10-CM

## 2017-02-17 DIAGNOSIS — L89524 Pressure ulcer of left ankle, stage 4: Secondary | ICD-10-CM

## 2017-02-17 DIAGNOSIS — L97322 Non-pressure chronic ulcer of left ankle with fat layer exposed: Secondary | ICD-10-CM | POA: Diagnosis not present

## 2017-02-20 ENCOUNTER — Ambulatory Visit (HOSPITAL_COMMUNITY): Admission: RE | Admit: 2017-02-20 | Payer: Medicare Other | Source: Ambulatory Visit

## 2017-02-24 ENCOUNTER — Ambulatory Visit (HOSPITAL_COMMUNITY)
Admission: RE | Admit: 2017-02-24 | Discharge: 2017-02-24 | Disposition: A | Discharge status: Home / Self Care | Payer: Medicare Other | Source: Ambulatory Visit | Attending: Internal Medicine | Admitting: Internal Medicine

## 2017-02-24 DIAGNOSIS — R938 Abnormal findings on diagnostic imaging of other specified body structures: Secondary | ICD-10-CM | POA: Insufficient documentation

## 2017-02-24 DIAGNOSIS — S91301A Unspecified open wound, right foot, initial encounter: Secondary | ICD-10-CM | POA: Insufficient documentation

## 2017-02-24 DIAGNOSIS — E11622 Type 2 diabetes mellitus with other skin ulcer: Secondary | ICD-10-CM

## 2017-02-24 DIAGNOSIS — L98499 Non-pressure chronic ulcer of skin of other sites with unspecified severity: Secondary | ICD-10-CM

## 2017-02-24 DIAGNOSIS — X58XXXA Exposure to other specified factors, initial encounter: Secondary | ICD-10-CM | POA: Diagnosis not present

## 2017-02-24 DIAGNOSIS — L89524 Pressure ulcer of left ankle, stage 4: Secondary | ICD-10-CM

## 2017-03-03 ENCOUNTER — Encounter (HOSPITAL_BASED_OUTPATIENT_CLINIC_OR_DEPARTMENT_OTHER): Payer: Medicare Other | Attending: Internal Medicine

## 2017-03-03 DIAGNOSIS — L97322 Non-pressure chronic ulcer of left ankle with fat layer exposed: Secondary | ICD-10-CM | POA: Insufficient documentation

## 2017-03-03 DIAGNOSIS — I10 Essential (primary) hypertension: Secondary | ICD-10-CM | POA: Diagnosis not present

## 2017-03-03 DIAGNOSIS — E1140 Type 2 diabetes mellitus with diabetic neuropathy, unspecified: Secondary | ICD-10-CM | POA: Diagnosis not present

## 2017-03-03 DIAGNOSIS — E11622 Type 2 diabetes mellitus with other skin ulcer: Secondary | ICD-10-CM | POA: Diagnosis not present

## 2017-03-10 DIAGNOSIS — E11622 Type 2 diabetes mellitus with other skin ulcer: Secondary | ICD-10-CM | POA: Diagnosis not present

## 2017-03-10 DIAGNOSIS — L97322 Non-pressure chronic ulcer of left ankle with fat layer exposed: Secondary | ICD-10-CM | POA: Diagnosis not present

## 2017-03-10 DIAGNOSIS — L97909 Non-pressure chronic ulcer of unspecified part of unspecified lower leg with unspecified severity: Secondary | ICD-10-CM | POA: Diagnosis not present

## 2017-03-17 DIAGNOSIS — E11622 Type 2 diabetes mellitus with other skin ulcer: Secondary | ICD-10-CM | POA: Diagnosis not present

## 2017-03-17 DIAGNOSIS — L97322 Non-pressure chronic ulcer of left ankle with fat layer exposed: Secondary | ICD-10-CM | POA: Diagnosis not present

## 2017-03-22 DIAGNOSIS — L97329 Non-pressure chronic ulcer of left ankle with unspecified severity: Secondary | ICD-10-CM | POA: Diagnosis not present

## 2017-03-22 DIAGNOSIS — L97309 Non-pressure chronic ulcer of unspecified ankle with unspecified severity: Secondary | ICD-10-CM | POA: Diagnosis not present

## 2017-03-22 DIAGNOSIS — E1140 Type 2 diabetes mellitus with diabetic neuropathy, unspecified: Secondary | ICD-10-CM | POA: Diagnosis not present

## 2017-03-22 DIAGNOSIS — E11622 Type 2 diabetes mellitus with other skin ulcer: Secondary | ICD-10-CM | POA: Diagnosis not present

## 2017-03-24 ENCOUNTER — Encounter (HOSPITAL_BASED_OUTPATIENT_CLINIC_OR_DEPARTMENT_OTHER): Payer: Medicare Other

## 2017-03-30 ENCOUNTER — Other Ambulatory Visit (HOSPITAL_COMMUNITY): Payer: Self-pay | Admitting: Podiatry

## 2017-03-30 DIAGNOSIS — Z89432 Acquired absence of left foot: Secondary | ICD-10-CM

## 2017-03-30 DIAGNOSIS — M21172 Varus deformity, not elsewhere classified, left ankle: Secondary | ICD-10-CM

## 2017-03-30 DIAGNOSIS — E1140 Type 2 diabetes mellitus with diabetic neuropathy, unspecified: Secondary | ICD-10-CM

## 2017-04-04 ENCOUNTER — Encounter (HOSPITAL_COMMUNITY): Payer: Self-pay

## 2017-04-04 ENCOUNTER — Ambulatory Visit (HOSPITAL_COMMUNITY)
Admission: RE | Admit: 2017-04-04 | Discharge: 2017-04-04 | Disposition: A | Discharge status: Home / Self Care | Payer: Medicare Other | Source: Ambulatory Visit | Attending: Podiatry | Admitting: Podiatry

## 2017-04-04 ENCOUNTER — Encounter: Payer: Self-pay | Admitting: Vascular Surgery

## 2017-04-04 DIAGNOSIS — E1140 Type 2 diabetes mellitus with diabetic neuropathy, unspecified: Secondary | ICD-10-CM

## 2017-04-04 DIAGNOSIS — Z89432 Acquired absence of left foot: Secondary | ICD-10-CM

## 2017-04-04 DIAGNOSIS — M21172 Varus deformity, not elsewhere classified, left ankle: Secondary | ICD-10-CM

## 2017-04-11 DIAGNOSIS — E11622 Type 2 diabetes mellitus with other skin ulcer: Secondary | ICD-10-CM | POA: Diagnosis not present

## 2017-04-11 DIAGNOSIS — E1140 Type 2 diabetes mellitus with diabetic neuropathy, unspecified: Secondary | ICD-10-CM | POA: Diagnosis not present

## 2017-04-11 DIAGNOSIS — L97309 Non-pressure chronic ulcer of unspecified ankle with unspecified severity: Secondary | ICD-10-CM | POA: Diagnosis not present

## 2017-04-14 ENCOUNTER — Encounter: Payer: Medicare Other | Admitting: Vascular Surgery

## 2017-05-04 DIAGNOSIS — L97329 Non-pressure chronic ulcer of left ankle with unspecified severity: Secondary | ICD-10-CM | POA: Diagnosis not present

## 2017-05-08 DIAGNOSIS — L97309 Non-pressure chronic ulcer of unspecified ankle with unspecified severity: Secondary | ICD-10-CM | POA: Diagnosis not present

## 2017-05-08 DIAGNOSIS — E11622 Type 2 diabetes mellitus with other skin ulcer: Secondary | ICD-10-CM | POA: Diagnosis not present

## 2017-05-08 DIAGNOSIS — E1140 Type 2 diabetes mellitus with diabetic neuropathy, unspecified: Secondary | ICD-10-CM | POA: Diagnosis not present

## 2017-05-30 DIAGNOSIS — E11621 Type 2 diabetes mellitus with foot ulcer: Secondary | ICD-10-CM | POA: Diagnosis not present

## 2017-05-30 DIAGNOSIS — I129 Hypertensive chronic kidney disease with stage 1 through stage 4 chronic kidney disease, or unspecified chronic kidney disease: Secondary | ICD-10-CM | POA: Diagnosis not present

## 2017-05-30 DIAGNOSIS — Z7982 Long term (current) use of aspirin: Secondary | ICD-10-CM | POA: Diagnosis not present

## 2017-05-30 DIAGNOSIS — Z888 Allergy status to other drugs, medicaments and biological substances status: Secondary | ICD-10-CM | POA: Diagnosis not present

## 2017-05-30 DIAGNOSIS — N189 Chronic kidney disease, unspecified: Secondary | ICD-10-CM | POA: Diagnosis not present

## 2017-05-30 DIAGNOSIS — G8918 Other acute postprocedural pain: Secondary | ICD-10-CM | POA: Diagnosis not present

## 2017-05-30 DIAGNOSIS — E1140 Type 2 diabetes mellitus with diabetic neuropathy, unspecified: Secondary | ICD-10-CM | POA: Diagnosis not present

## 2017-05-30 DIAGNOSIS — E1122 Type 2 diabetes mellitus with diabetic chronic kidney disease: Secondary | ICD-10-CM | POA: Diagnosis not present

## 2017-05-30 DIAGNOSIS — E11622 Type 2 diabetes mellitus with other skin ulcer: Secondary | ICD-10-CM | POA: Diagnosis not present

## 2017-05-30 DIAGNOSIS — Z885 Allergy status to narcotic agent status: Secondary | ICD-10-CM | POA: Diagnosis not present

## 2017-05-30 DIAGNOSIS — L97329 Non-pressure chronic ulcer of left ankle with unspecified severity: Secondary | ICD-10-CM | POA: Diagnosis not present

## 2017-05-30 DIAGNOSIS — K219 Gastro-esophageal reflux disease without esophagitis: Secondary | ICD-10-CM | POA: Diagnosis not present

## 2017-06-13 DIAGNOSIS — E11628 Type 2 diabetes mellitus with other skin complications: Secondary | ICD-10-CM | POA: Diagnosis not present

## 2017-06-13 DIAGNOSIS — L089 Local infection of the skin and subcutaneous tissue, unspecified: Secondary | ICD-10-CM | POA: Diagnosis not present

## 2017-06-14 DIAGNOSIS — E1140 Type 2 diabetes mellitus with diabetic neuropathy, unspecified: Secondary | ICD-10-CM | POA: Diagnosis not present

## 2017-06-14 DIAGNOSIS — A491 Streptococcal infection, unspecified site: Secondary | ICD-10-CM | POA: Diagnosis not present

## 2017-06-14 DIAGNOSIS — E1110 Type 2 diabetes mellitus with ketoacidosis without coma: Secondary | ICD-10-CM | POA: Diagnosis not present

## 2017-06-14 DIAGNOSIS — D62 Acute posthemorrhagic anemia: Secondary | ICD-10-CM | POA: Diagnosis not present

## 2017-06-14 DIAGNOSIS — M86172 Other acute osteomyelitis, left ankle and foot: Secondary | ICD-10-CM | POA: Diagnosis not present

## 2017-06-14 DIAGNOSIS — N183 Chronic kidney disease, stage 3 (moderate): Secondary | ICD-10-CM | POA: Diagnosis not present

## 2017-06-14 DIAGNOSIS — M868X7 Other osteomyelitis, ankle and foot: Secondary | ICD-10-CM | POA: Diagnosis not present

## 2017-06-14 DIAGNOSIS — E11628 Type 2 diabetes mellitus with other skin complications: Secondary | ICD-10-CM | POA: Diagnosis not present

## 2017-06-14 DIAGNOSIS — E871 Hypo-osmolality and hyponatremia: Secondary | ICD-10-CM | POA: Diagnosis not present

## 2017-06-14 DIAGNOSIS — E11621 Type 2 diabetes mellitus with foot ulcer: Secondary | ICD-10-CM | POA: Diagnosis not present

## 2017-06-14 DIAGNOSIS — F329 Major depressive disorder, single episode, unspecified: Secondary | ICD-10-CM | POA: Diagnosis not present

## 2017-06-14 DIAGNOSIS — L089 Local infection of the skin and subcutaneous tissue, unspecified: Secondary | ICD-10-CM | POA: Diagnosis not present

## 2017-06-14 DIAGNOSIS — E1152 Type 2 diabetes mellitus with diabetic peripheral angiopathy with gangrene: Secondary | ICD-10-CM | POA: Diagnosis not present

## 2017-06-14 DIAGNOSIS — L97529 Non-pressure chronic ulcer of other part of left foot with unspecified severity: Secondary | ICD-10-CM | POA: Diagnosis not present

## 2017-06-14 DIAGNOSIS — B951 Streptococcus, group B, as the cause of diseases classified elsewhere: Secondary | ICD-10-CM | POA: Diagnosis not present

## 2017-06-14 DIAGNOSIS — M7989 Other specified soft tissue disorders: Secondary | ICD-10-CM | POA: Diagnosis not present

## 2017-06-14 DIAGNOSIS — E11622 Type 2 diabetes mellitus with other skin ulcer: Secondary | ICD-10-CM | POA: Diagnosis not present

## 2017-06-14 DIAGNOSIS — L97521 Non-pressure chronic ulcer of other part of left foot limited to breakdown of skin: Secondary | ICD-10-CM | POA: Diagnosis not present

## 2017-06-14 DIAGNOSIS — E872 Acidosis: Secondary | ICD-10-CM | POA: Diagnosis not present

## 2017-06-14 DIAGNOSIS — I96 Gangrene, not elsewhere classified: Secondary | ICD-10-CM | POA: Diagnosis not present

## 2017-06-14 DIAGNOSIS — L03818 Cellulitis of other sites: Secondary | ICD-10-CM | POA: Diagnosis not present

## 2017-06-14 DIAGNOSIS — I10 Essential (primary) hypertension: Secondary | ICD-10-CM | POA: Diagnosis not present

## 2017-06-15 DIAGNOSIS — E872 Acidosis: Secondary | ICD-10-CM | POA: Diagnosis not present

## 2017-06-15 DIAGNOSIS — I10 Essential (primary) hypertension: Secondary | ICD-10-CM | POA: Diagnosis not present

## 2017-06-15 DIAGNOSIS — N183 Chronic kidney disease, stage 3 (moderate): Secondary | ICD-10-CM | POA: Diagnosis not present

## 2017-06-15 DIAGNOSIS — L97521 Non-pressure chronic ulcer of other part of left foot limited to breakdown of skin: Secondary | ICD-10-CM | POA: Diagnosis not present

## 2017-06-15 DIAGNOSIS — E11628 Type 2 diabetes mellitus with other skin complications: Secondary | ICD-10-CM | POA: Diagnosis not present

## 2017-06-15 DIAGNOSIS — E11621 Type 2 diabetes mellitus with foot ulcer: Secondary | ICD-10-CM | POA: Diagnosis not present

## 2017-06-15 DIAGNOSIS — M7989 Other specified soft tissue disorders: Secondary | ICD-10-CM | POA: Diagnosis not present

## 2017-06-16 DIAGNOSIS — I10 Essential (primary) hypertension: Secondary | ICD-10-CM | POA: Diagnosis not present

## 2017-06-16 DIAGNOSIS — E11628 Type 2 diabetes mellitus with other skin complications: Secondary | ICD-10-CM | POA: Diagnosis not present

## 2017-06-16 DIAGNOSIS — I96 Gangrene, not elsewhere classified: Secondary | ICD-10-CM | POA: Diagnosis not present

## 2017-06-16 DIAGNOSIS — N183 Chronic kidney disease, stage 3 (moderate): Secondary | ICD-10-CM | POA: Diagnosis not present

## 2017-06-16 DIAGNOSIS — E872 Acidosis: Secondary | ICD-10-CM | POA: Diagnosis not present

## 2017-06-17 DIAGNOSIS — E11628 Type 2 diabetes mellitus with other skin complications: Secondary | ICD-10-CM | POA: Diagnosis not present

## 2017-06-17 DIAGNOSIS — E872 Acidosis: Secondary | ICD-10-CM | POA: Diagnosis not present

## 2017-06-17 DIAGNOSIS — I10 Essential (primary) hypertension: Secondary | ICD-10-CM | POA: Diagnosis not present

## 2017-06-17 DIAGNOSIS — N183 Chronic kidney disease, stage 3 (moderate): Secondary | ICD-10-CM | POA: Diagnosis not present

## 2017-06-18 DIAGNOSIS — E11628 Type 2 diabetes mellitus with other skin complications: Secondary | ICD-10-CM | POA: Diagnosis not present

## 2017-06-18 DIAGNOSIS — E872 Acidosis: Secondary | ICD-10-CM | POA: Diagnosis not present

## 2017-06-18 DIAGNOSIS — N183 Chronic kidney disease, stage 3 (moderate): Secondary | ICD-10-CM | POA: Diagnosis not present

## 2017-06-18 DIAGNOSIS — I10 Essential (primary) hypertension: Secondary | ICD-10-CM | POA: Diagnosis not present

## 2017-06-19 DIAGNOSIS — I10 Essential (primary) hypertension: Secondary | ICD-10-CM | POA: Diagnosis not present

## 2017-06-19 DIAGNOSIS — N183 Chronic kidney disease, stage 3 (moderate): Secondary | ICD-10-CM | POA: Diagnosis not present

## 2017-06-19 DIAGNOSIS — E872 Acidosis: Secondary | ICD-10-CM | POA: Diagnosis not present

## 2017-06-19 DIAGNOSIS — E11628 Type 2 diabetes mellitus with other skin complications: Secondary | ICD-10-CM | POA: Diagnosis not present

## 2017-06-20 DIAGNOSIS — I10 Essential (primary) hypertension: Secondary | ICD-10-CM | POA: Diagnosis not present

## 2017-06-20 DIAGNOSIS — E11621 Type 2 diabetes mellitus with foot ulcer: Secondary | ICD-10-CM | POA: Diagnosis not present

## 2017-06-20 DIAGNOSIS — F329 Major depressive disorder, single episode, unspecified: Secondary | ICD-10-CM | POA: Diagnosis not present

## 2017-06-20 DIAGNOSIS — E11628 Type 2 diabetes mellitus with other skin complications: Secondary | ICD-10-CM | POA: Diagnosis not present

## 2017-06-20 DIAGNOSIS — L089 Local infection of the skin and subcutaneous tissue, unspecified: Secondary | ICD-10-CM | POA: Diagnosis not present

## 2017-06-20 DIAGNOSIS — L97529 Non-pressure chronic ulcer of other part of left foot with unspecified severity: Secondary | ICD-10-CM | POA: Diagnosis not present

## 2017-06-20 DIAGNOSIS — N183 Chronic kidney disease, stage 3 (moderate): Secondary | ICD-10-CM | POA: Diagnosis not present

## 2017-06-21 DIAGNOSIS — I10 Essential (primary) hypertension: Secondary | ICD-10-CM | POA: Diagnosis not present

## 2017-06-21 DIAGNOSIS — N183 Chronic kidney disease, stage 3 (moderate): Secondary | ICD-10-CM | POA: Diagnosis not present

## 2017-06-21 DIAGNOSIS — F329 Major depressive disorder, single episode, unspecified: Secondary | ICD-10-CM | POA: Diagnosis not present

## 2017-06-21 DIAGNOSIS — E11628 Type 2 diabetes mellitus with other skin complications: Secondary | ICD-10-CM | POA: Diagnosis not present

## 2017-06-22 DIAGNOSIS — I10 Essential (primary) hypertension: Secondary | ICD-10-CM | POA: Diagnosis not present

## 2017-06-22 DIAGNOSIS — E1140 Type 2 diabetes mellitus with diabetic neuropathy, unspecified: Secondary | ICD-10-CM | POA: Diagnosis not present

## 2017-06-22 DIAGNOSIS — E11628 Type 2 diabetes mellitus with other skin complications: Secondary | ICD-10-CM | POA: Diagnosis not present

## 2017-06-22 DIAGNOSIS — M86172 Other acute osteomyelitis, left ankle and foot: Secondary | ICD-10-CM | POA: Diagnosis not present

## 2017-06-22 DIAGNOSIS — A491 Streptococcal infection, unspecified site: Secondary | ICD-10-CM | POA: Diagnosis not present

## 2017-06-22 DIAGNOSIS — N183 Chronic kidney disease, stage 3 (moderate): Secondary | ICD-10-CM | POA: Diagnosis not present

## 2017-06-22 DIAGNOSIS — F329 Major depressive disorder, single episode, unspecified: Secondary | ICD-10-CM | POA: Diagnosis not present

## 2017-06-23 DIAGNOSIS — E11628 Type 2 diabetes mellitus with other skin complications: Secondary | ICD-10-CM | POA: Diagnosis not present

## 2017-06-23 DIAGNOSIS — M86172 Other acute osteomyelitis, left ankle and foot: Secondary | ICD-10-CM | POA: Diagnosis not present

## 2017-06-23 DIAGNOSIS — I10 Essential (primary) hypertension: Secondary | ICD-10-CM | POA: Diagnosis not present

## 2017-06-23 DIAGNOSIS — E1140 Type 2 diabetes mellitus with diabetic neuropathy, unspecified: Secondary | ICD-10-CM | POA: Diagnosis not present

## 2017-06-23 DIAGNOSIS — L089 Local infection of the skin and subcutaneous tissue, unspecified: Secondary | ICD-10-CM | POA: Diagnosis not present

## 2017-06-23 DIAGNOSIS — A491 Streptococcal infection, unspecified site: Secondary | ICD-10-CM | POA: Diagnosis not present

## 2017-06-23 DIAGNOSIS — N183 Chronic kidney disease, stage 3 (moderate): Secondary | ICD-10-CM | POA: Diagnosis not present

## 2017-06-23 DIAGNOSIS — F329 Major depressive disorder, single episode, unspecified: Secondary | ICD-10-CM | POA: Diagnosis not present

## 2017-06-24 DIAGNOSIS — E11628 Type 2 diabetes mellitus with other skin complications: Secondary | ICD-10-CM | POA: Diagnosis not present

## 2017-06-24 DIAGNOSIS — I10 Essential (primary) hypertension: Secondary | ICD-10-CM | POA: Diagnosis not present

## 2017-06-24 DIAGNOSIS — N183 Chronic kidney disease, stage 3 (moderate): Secondary | ICD-10-CM | POA: Diagnosis not present

## 2017-06-24 DIAGNOSIS — M86172 Other acute osteomyelitis, left ankle and foot: Secondary | ICD-10-CM | POA: Diagnosis not present

## 2017-06-25 DIAGNOSIS — M86172 Other acute osteomyelitis, left ankle and foot: Secondary | ICD-10-CM | POA: Diagnosis not present

## 2017-06-25 DIAGNOSIS — E11628 Type 2 diabetes mellitus with other skin complications: Secondary | ICD-10-CM | POA: Diagnosis not present

## 2017-06-25 DIAGNOSIS — I10 Essential (primary) hypertension: Secondary | ICD-10-CM | POA: Diagnosis not present

## 2017-06-25 DIAGNOSIS — N183 Chronic kidney disease, stage 3 (moderate): Secondary | ICD-10-CM | POA: Diagnosis not present

## 2017-06-26 DIAGNOSIS — E11628 Type 2 diabetes mellitus with other skin complications: Secondary | ICD-10-CM | POA: Diagnosis not present

## 2017-06-26 DIAGNOSIS — A491 Streptococcal infection, unspecified site: Secondary | ICD-10-CM | POA: Diagnosis not present

## 2017-06-26 DIAGNOSIS — N183 Chronic kidney disease, stage 3 (moderate): Secondary | ICD-10-CM | POA: Diagnosis not present

## 2017-06-26 DIAGNOSIS — I10 Essential (primary) hypertension: Secondary | ICD-10-CM | POA: Diagnosis not present

## 2017-06-26 DIAGNOSIS — M86172 Other acute osteomyelitis, left ankle and foot: Secondary | ICD-10-CM | POA: Diagnosis not present

## 2017-06-26 DIAGNOSIS — E1140 Type 2 diabetes mellitus with diabetic neuropathy, unspecified: Secondary | ICD-10-CM | POA: Diagnosis not present

## 2017-06-27 DIAGNOSIS — E1169 Type 2 diabetes mellitus with other specified complication: Secondary | ICD-10-CM | POA: Diagnosis not present

## 2017-06-27 DIAGNOSIS — E1122 Type 2 diabetes mellitus with diabetic chronic kidney disease: Secondary | ICD-10-CM | POA: Diagnosis not present

## 2017-06-27 DIAGNOSIS — L97426 Non-pressure chronic ulcer of left heel and midfoot with bone involvement without evidence of necrosis: Secondary | ICD-10-CM | POA: Diagnosis not present

## 2017-06-27 DIAGNOSIS — M86172 Other acute osteomyelitis, left ankle and foot: Secondary | ICD-10-CM | POA: Diagnosis not present

## 2017-06-27 DIAGNOSIS — B951 Streptococcus, group B, as the cause of diseases classified elsewhere: Secondary | ICD-10-CM | POA: Diagnosis not present

## 2017-06-27 DIAGNOSIS — E11622 Type 2 diabetes mellitus with other skin ulcer: Secondary | ICD-10-CM | POA: Diagnosis not present

## 2017-06-27 DIAGNOSIS — E1142 Type 2 diabetes mellitus with diabetic polyneuropathy: Secondary | ICD-10-CM | POA: Diagnosis not present

## 2017-06-27 DIAGNOSIS — Z4789 Encounter for other orthopedic aftercare: Secondary | ICD-10-CM | POA: Diagnosis not present

## 2017-06-27 DIAGNOSIS — L97422 Non-pressure chronic ulcer of left heel and midfoot with fat layer exposed: Secondary | ICD-10-CM | POA: Diagnosis not present

## 2017-06-27 DIAGNOSIS — Z452 Encounter for adjustment and management of vascular access device: Secondary | ICD-10-CM | POA: Diagnosis not present

## 2017-06-27 DIAGNOSIS — L97322 Non-pressure chronic ulcer of left ankle with fat layer exposed: Secondary | ICD-10-CM | POA: Diagnosis not present

## 2017-06-27 DIAGNOSIS — T8189XA Other complications of procedures, not elsewhere classified, initial encounter: Secondary | ICD-10-CM | POA: Diagnosis not present

## 2017-06-27 DIAGNOSIS — E1165 Type 2 diabetes mellitus with hyperglycemia: Secondary | ICD-10-CM | POA: Diagnosis not present

## 2017-06-27 DIAGNOSIS — E11621 Type 2 diabetes mellitus with foot ulcer: Secondary | ICD-10-CM | POA: Diagnosis not present

## 2017-07-03 DIAGNOSIS — Z5181 Encounter for therapeutic drug level monitoring: Secondary | ICD-10-CM | POA: Diagnosis not present

## 2017-07-06 DIAGNOSIS — E13621 Other specified diabetes mellitus with foot ulcer: Secondary | ICD-10-CM | POA: Diagnosis not present

## 2017-07-06 DIAGNOSIS — E11628 Type 2 diabetes mellitus with other skin complications: Secondary | ICD-10-CM | POA: Diagnosis not present

## 2017-07-06 DIAGNOSIS — L97402 Non-pressure chronic ulcer of unspecified heel and midfoot with fat layer exposed: Secondary | ICD-10-CM | POA: Diagnosis not present

## 2017-07-06 DIAGNOSIS — L089 Local infection of the skin and subcutaneous tissue, unspecified: Secondary | ICD-10-CM | POA: Diagnosis not present

## 2017-07-10 DIAGNOSIS — Z79899 Other long term (current) drug therapy: Secondary | ICD-10-CM | POA: Diagnosis not present

## 2017-07-10 DIAGNOSIS — Z5181 Encounter for therapeutic drug level monitoring: Secondary | ICD-10-CM | POA: Diagnosis not present

## 2017-07-10 DIAGNOSIS — M86172 Other acute osteomyelitis, left ankle and foot: Secondary | ICD-10-CM | POA: Diagnosis not present

## 2017-07-17 DIAGNOSIS — M86172 Other acute osteomyelitis, left ankle and foot: Secondary | ICD-10-CM | POA: Diagnosis not present

## 2017-07-17 DIAGNOSIS — Z5181 Encounter for therapeutic drug level monitoring: Secondary | ICD-10-CM | POA: Diagnosis not present

## 2017-07-24 DIAGNOSIS — Z5181 Encounter for therapeutic drug level monitoring: Secondary | ICD-10-CM | POA: Diagnosis not present

## 2017-07-24 DIAGNOSIS — M8618 Other acute osteomyelitis, other site: Secondary | ICD-10-CM | POA: Diagnosis not present

## 2017-07-25 DIAGNOSIS — H2513 Age-related nuclear cataract, bilateral: Secondary | ICD-10-CM | POA: Diagnosis not present

## 2017-07-25 DIAGNOSIS — H4311 Vitreous hemorrhage, right eye: Secondary | ICD-10-CM | POA: Diagnosis not present

## 2017-07-25 DIAGNOSIS — E113513 Type 2 diabetes mellitus with proliferative diabetic retinopathy with macular edema, bilateral: Secondary | ICD-10-CM | POA: Diagnosis not present

## 2017-07-25 DIAGNOSIS — H211X3 Other vascular disorders of iris and ciliary body, bilateral: Secondary | ICD-10-CM | POA: Diagnosis not present

## 2017-07-27 DIAGNOSIS — T8189XA Other complications of procedures, not elsewhere classified, initial encounter: Secondary | ICD-10-CM | POA: Diagnosis not present

## 2017-07-27 DIAGNOSIS — M86472 Chronic osteomyelitis with draining sinus, left ankle and foot: Secondary | ICD-10-CM | POA: Diagnosis not present

## 2017-07-27 DIAGNOSIS — E1140 Type 2 diabetes mellitus with diabetic neuropathy, unspecified: Secondary | ICD-10-CM | POA: Diagnosis not present

## 2017-07-27 DIAGNOSIS — Z794 Long term (current) use of insulin: Secondary | ICD-10-CM | POA: Diagnosis not present

## 2017-07-27 DIAGNOSIS — A491 Streptococcal infection, unspecified site: Secondary | ICD-10-CM | POA: Diagnosis not present

## 2017-08-10 DIAGNOSIS — E113513 Type 2 diabetes mellitus with proliferative diabetic retinopathy with macular edema, bilateral: Secondary | ICD-10-CM | POA: Diagnosis not present

## 2017-08-10 DIAGNOSIS — H4311 Vitreous hemorrhage, right eye: Secondary | ICD-10-CM | POA: Diagnosis not present

## 2017-08-10 DIAGNOSIS — H35371 Puckering of macula, right eye: Secondary | ICD-10-CM | POA: Diagnosis not present

## 2017-08-11 DIAGNOSIS — E113513 Type 2 diabetes mellitus with proliferative diabetic retinopathy with macular edema, bilateral: Secondary | ICD-10-CM | POA: Diagnosis not present

## 2017-08-11 DIAGNOSIS — H4311 Vitreous hemorrhage, right eye: Secondary | ICD-10-CM | POA: Diagnosis not present

## 2017-08-23 DIAGNOSIS — E113513 Type 2 diabetes mellitus with proliferative diabetic retinopathy with macular edema, bilateral: Secondary | ICD-10-CM | POA: Diagnosis not present

## 2017-08-28 DIAGNOSIS — I10 Essential (primary) hypertension: Secondary | ICD-10-CM | POA: Diagnosis not present

## 2017-08-28 DIAGNOSIS — E11622 Type 2 diabetes mellitus with other skin ulcer: Secondary | ICD-10-CM | POA: Diagnosis not present

## 2017-08-28 DIAGNOSIS — L97309 Non-pressure chronic ulcer of unspecified ankle with unspecified severity: Secondary | ICD-10-CM | POA: Diagnosis not present

## 2017-09-13 DIAGNOSIS — E113513 Type 2 diabetes mellitus with proliferative diabetic retinopathy with macular edema, bilateral: Secondary | ICD-10-CM | POA: Diagnosis not present

## 2017-09-28 DIAGNOSIS — I10 Essential (primary) hypertension: Secondary | ICD-10-CM | POA: Diagnosis not present

## 2017-09-28 DIAGNOSIS — R52 Pain, unspecified: Secondary | ICD-10-CM | POA: Diagnosis not present

## 2017-09-28 DIAGNOSIS — E11622 Type 2 diabetes mellitus with other skin ulcer: Secondary | ICD-10-CM | POA: Diagnosis not present

## 2017-09-28 DIAGNOSIS — L97309 Non-pressure chronic ulcer of unspecified ankle with unspecified severity: Secondary | ICD-10-CM | POA: Diagnosis not present

## 2017-09-30 DIAGNOSIS — E11610 Type 2 diabetes mellitus with diabetic neuropathic arthropathy: Secondary | ICD-10-CM | POA: Diagnosis not present

## 2017-09-30 DIAGNOSIS — M14672 Charcot's joint, left ankle and foot: Secondary | ICD-10-CM | POA: Diagnosis not present

## 2017-09-30 DIAGNOSIS — M14671 Charcot's joint, right ankle and foot: Secondary | ICD-10-CM | POA: Diagnosis not present

## 2017-09-30 DIAGNOSIS — E1165 Type 2 diabetes mellitus with hyperglycemia: Secondary | ICD-10-CM | POA: Diagnosis not present

## 2017-11-10 DIAGNOSIS — E11610 Type 2 diabetes mellitus with diabetic neuropathic arthropathy: Secondary | ICD-10-CM | POA: Diagnosis not present

## 2017-11-10 DIAGNOSIS — K219 Gastro-esophageal reflux disease without esophagitis: Secondary | ICD-10-CM | POA: Diagnosis not present

## 2017-11-10 DIAGNOSIS — E7800 Pure hypercholesterolemia, unspecified: Secondary | ICD-10-CM | POA: Diagnosis not present

## 2017-11-10 DIAGNOSIS — I10 Essential (primary) hypertension: Secondary | ICD-10-CM | POA: Diagnosis not present

## 2017-11-15 DIAGNOSIS — M129 Arthropathy, unspecified: Secondary | ICD-10-CM | POA: Diagnosis not present

## 2017-11-15 DIAGNOSIS — M14671 Charcot's joint, right ankle and foot: Secondary | ICD-10-CM | POA: Diagnosis not present

## 2017-11-15 DIAGNOSIS — I10 Essential (primary) hypertension: Secondary | ICD-10-CM | POA: Diagnosis not present

## 2017-11-15 DIAGNOSIS — M14672 Charcot's joint, left ankle and foot: Secondary | ICD-10-CM | POA: Diagnosis not present

## 2017-12-15 DIAGNOSIS — H35033 Hypertensive retinopathy, bilateral: Secondary | ICD-10-CM | POA: Diagnosis not present

## 2017-12-15 DIAGNOSIS — H25043 Posterior subcapsular polar age-related cataract, bilateral: Secondary | ICD-10-CM | POA: Diagnosis not present

## 2017-12-15 DIAGNOSIS — H2513 Age-related nuclear cataract, bilateral: Secondary | ICD-10-CM | POA: Diagnosis not present

## 2017-12-15 DIAGNOSIS — E113513 Type 2 diabetes mellitus with proliferative diabetic retinopathy with macular edema, bilateral: Secondary | ICD-10-CM | POA: Diagnosis not present

## 2018-01-02 DIAGNOSIS — H2512 Age-related nuclear cataract, left eye: Secondary | ICD-10-CM | POA: Diagnosis not present

## 2018-01-02 DIAGNOSIS — H25812 Combined forms of age-related cataract, left eye: Secondary | ICD-10-CM | POA: Diagnosis not present

## 2018-01-10 DIAGNOSIS — H2511 Age-related nuclear cataract, right eye: Secondary | ICD-10-CM | POA: Diagnosis not present

## 2018-01-10 DIAGNOSIS — E113591 Type 2 diabetes mellitus with proliferative diabetic retinopathy without macular edema, right eye: Secondary | ICD-10-CM | POA: Diagnosis not present

## 2018-01-10 DIAGNOSIS — E113512 Type 2 diabetes mellitus with proliferative diabetic retinopathy with macular edema, left eye: Secondary | ICD-10-CM | POA: Diagnosis not present

## 2018-01-10 DIAGNOSIS — H26492 Other secondary cataract, left eye: Secondary | ICD-10-CM | POA: Diagnosis not present

## 2018-03-01 DIAGNOSIS — E11621 Type 2 diabetes mellitus with foot ulcer: Secondary | ICD-10-CM | POA: Diagnosis not present

## 2018-04-11 DIAGNOSIS — E113592 Type 2 diabetes mellitus with proliferative diabetic retinopathy without macular edema, left eye: Secondary | ICD-10-CM | POA: Diagnosis not present

## 2018-04-11 DIAGNOSIS — E113511 Type 2 diabetes mellitus with proliferative diabetic retinopathy with macular edema, right eye: Secondary | ICD-10-CM | POA: Diagnosis not present

## 2018-04-11 DIAGNOSIS — H2511 Age-related nuclear cataract, right eye: Secondary | ICD-10-CM | POA: Diagnosis not present

## 2018-06-15 DIAGNOSIS — Z0001 Encounter for general adult medical examination with abnormal findings: Secondary | ICD-10-CM | POA: Diagnosis not present

## 2018-06-21 DIAGNOSIS — M129 Arthropathy, unspecified: Secondary | ICD-10-CM | POA: Diagnosis not present

## 2018-06-21 DIAGNOSIS — M14672 Charcot's joint, left ankle and foot: Secondary | ICD-10-CM | POA: Diagnosis not present

## 2018-06-21 DIAGNOSIS — M14671 Charcot's joint, right ankle and foot: Secondary | ICD-10-CM | POA: Diagnosis not present

## 2018-06-21 DIAGNOSIS — I10 Essential (primary) hypertension: Secondary | ICD-10-CM | POA: Diagnosis not present

## 2018-07-11 DIAGNOSIS — H2511 Age-related nuclear cataract, right eye: Secondary | ICD-10-CM | POA: Diagnosis not present

## 2018-07-11 DIAGNOSIS — E113591 Type 2 diabetes mellitus with proliferative diabetic retinopathy without macular edema, right eye: Secondary | ICD-10-CM | POA: Diagnosis not present

## 2018-07-11 DIAGNOSIS — E113512 Type 2 diabetes mellitus with proliferative diabetic retinopathy with macular edema, left eye: Secondary | ICD-10-CM | POA: Diagnosis not present

## 2018-07-24 DIAGNOSIS — E113512 Type 2 diabetes mellitus with proliferative diabetic retinopathy with macular edema, left eye: Secondary | ICD-10-CM | POA: Diagnosis not present

## 2018-10-23 DIAGNOSIS — H2511 Age-related nuclear cataract, right eye: Secondary | ICD-10-CM | POA: Diagnosis not present

## 2018-10-23 DIAGNOSIS — E113512 Type 2 diabetes mellitus with proliferative diabetic retinopathy with macular edema, left eye: Secondary | ICD-10-CM | POA: Diagnosis not present

## 2018-10-23 DIAGNOSIS — E113591 Type 2 diabetes mellitus with proliferative diabetic retinopathy without macular edema, right eye: Secondary | ICD-10-CM | POA: Diagnosis not present

## 2018-11-12 DIAGNOSIS — E1165 Type 2 diabetes mellitus with hyperglycemia: Secondary | ICD-10-CM | POA: Diagnosis not present

## 2018-11-12 DIAGNOSIS — D649 Anemia, unspecified: Secondary | ICD-10-CM | POA: Diagnosis not present

## 2018-11-12 DIAGNOSIS — R42 Dizziness and giddiness: Secondary | ICD-10-CM | POA: Diagnosis not present

## 2018-11-12 DIAGNOSIS — I10 Essential (primary) hypertension: Secondary | ICD-10-CM | POA: Diagnosis not present

## 2019-02-21 DIAGNOSIS — H2511 Age-related nuclear cataract, right eye: Secondary | ICD-10-CM | POA: Diagnosis not present

## 2019-02-21 DIAGNOSIS — H40013 Open angle with borderline findings, low risk, bilateral: Secondary | ICD-10-CM | POA: Diagnosis not present

## 2019-02-21 DIAGNOSIS — H35033 Hypertensive retinopathy, bilateral: Secondary | ICD-10-CM | POA: Diagnosis not present

## 2019-02-21 DIAGNOSIS — H25011 Cortical age-related cataract, right eye: Secondary | ICD-10-CM | POA: Diagnosis not present

## 2019-02-26 DIAGNOSIS — E113513 Type 2 diabetes mellitus with proliferative diabetic retinopathy with macular edema, bilateral: Secondary | ICD-10-CM | POA: Diagnosis not present

## 2019-02-26 DIAGNOSIS — E113511 Type 2 diabetes mellitus with proliferative diabetic retinopathy with macular edema, right eye: Secondary | ICD-10-CM | POA: Diagnosis not present

## 2019-02-26 DIAGNOSIS — H26492 Other secondary cataract, left eye: Secondary | ICD-10-CM | POA: Diagnosis not present

## 2019-02-26 DIAGNOSIS — H2511 Age-related nuclear cataract, right eye: Secondary | ICD-10-CM | POA: Diagnosis not present

## 2019-03-08 DIAGNOSIS — E11610 Type 2 diabetes mellitus with diabetic neuropathic arthropathy: Secondary | ICD-10-CM | POA: Diagnosis not present

## 2019-03-08 DIAGNOSIS — E11319 Type 2 diabetes mellitus with unspecified diabetic retinopathy without macular edema: Secondary | ICD-10-CM | POA: Diagnosis not present

## 2019-03-08 DIAGNOSIS — E11621 Type 2 diabetes mellitus with foot ulcer: Secondary | ICD-10-CM | POA: Diagnosis not present

## 2019-03-15 DIAGNOSIS — E113513 Type 2 diabetes mellitus with proliferative diabetic retinopathy with macular edema, bilateral: Secondary | ICD-10-CM | POA: Diagnosis not present

## 2019-03-19 DIAGNOSIS — E11319 Type 2 diabetes mellitus with unspecified diabetic retinopathy without macular edema: Secondary | ICD-10-CM | POA: Diagnosis not present

## 2019-03-19 DIAGNOSIS — E11610 Type 2 diabetes mellitus with diabetic neuropathic arthropathy: Secondary | ICD-10-CM | POA: Diagnosis not present

## 2019-03-19 DIAGNOSIS — E1165 Type 2 diabetes mellitus with hyperglycemia: Secondary | ICD-10-CM | POA: Diagnosis not present

## 2019-03-22 DIAGNOSIS — I739 Peripheral vascular disease, unspecified: Secondary | ICD-10-CM | POA: Diagnosis not present

## 2019-03-22 DIAGNOSIS — E1165 Type 2 diabetes mellitus with hyperglycemia: Secondary | ICD-10-CM | POA: Diagnosis not present

## 2019-03-22 DIAGNOSIS — I10 Essential (primary) hypertension: Secondary | ICD-10-CM | POA: Diagnosis not present

## 2019-03-26 DIAGNOSIS — H25011 Cortical age-related cataract, right eye: Secondary | ICD-10-CM | POA: Diagnosis not present

## 2019-03-26 DIAGNOSIS — H2511 Age-related nuclear cataract, right eye: Secondary | ICD-10-CM | POA: Diagnosis not present

## 2019-03-26 DIAGNOSIS — H25041 Posterior subcapsular polar age-related cataract, right eye: Secondary | ICD-10-CM | POA: Diagnosis not present

## 2019-04-05 DIAGNOSIS — M129 Arthropathy, unspecified: Secondary | ICD-10-CM | POA: Diagnosis not present

## 2019-04-05 DIAGNOSIS — E11610 Type 2 diabetes mellitus with diabetic neuropathic arthropathy: Secondary | ICD-10-CM | POA: Diagnosis not present

## 2019-04-05 DIAGNOSIS — E1165 Type 2 diabetes mellitus with hyperglycemia: Secondary | ICD-10-CM | POA: Diagnosis not present

## 2019-04-22 DIAGNOSIS — I10 Essential (primary) hypertension: Secondary | ICD-10-CM | POA: Diagnosis not present

## 2019-04-22 DIAGNOSIS — E1165 Type 2 diabetes mellitus with hyperglycemia: Secondary | ICD-10-CM | POA: Diagnosis not present

## 2019-05-31 DIAGNOSIS — K219 Gastro-esophageal reflux disease without esophagitis: Secondary | ICD-10-CM | POA: Diagnosis not present

## 2019-05-31 DIAGNOSIS — R42 Dizziness and giddiness: Secondary | ICD-10-CM | POA: Diagnosis not present

## 2019-05-31 DIAGNOSIS — E1165 Type 2 diabetes mellitus with hyperglycemia: Secondary | ICD-10-CM | POA: Diagnosis not present

## 2019-05-31 DIAGNOSIS — I10 Essential (primary) hypertension: Secondary | ICD-10-CM | POA: Diagnosis not present

## 2019-06-05 DIAGNOSIS — I10 Essential (primary) hypertension: Secondary | ICD-10-CM | POA: Diagnosis not present

## 2019-06-05 DIAGNOSIS — E11610 Type 2 diabetes mellitus with diabetic neuropathic arthropathy: Secondary | ICD-10-CM | POA: Diagnosis not present

## 2019-06-05 DIAGNOSIS — M14672 Charcot's joint, left ankle and foot: Secondary | ICD-10-CM | POA: Diagnosis not present

## 2019-06-05 DIAGNOSIS — D649 Anemia, unspecified: Secondary | ICD-10-CM | POA: Diagnosis not present

## 2019-06-05 DIAGNOSIS — M129 Arthropathy, unspecified: Secondary | ICD-10-CM | POA: Diagnosis not present

## 2019-06-21 DIAGNOSIS — E1165 Type 2 diabetes mellitus with hyperglycemia: Secondary | ICD-10-CM | POA: Diagnosis not present

## 2019-06-21 DIAGNOSIS — I10 Essential (primary) hypertension: Secondary | ICD-10-CM | POA: Diagnosis not present

## 2019-09-20 DIAGNOSIS — I10 Essential (primary) hypertension: Secondary | ICD-10-CM | POA: Diagnosis not present

## 2019-09-20 DIAGNOSIS — E1165 Type 2 diabetes mellitus with hyperglycemia: Secondary | ICD-10-CM | POA: Diagnosis not present

## 2019-10-01 DIAGNOSIS — E1165 Type 2 diabetes mellitus with hyperglycemia: Secondary | ICD-10-CM | POA: Diagnosis not present

## 2019-10-01 DIAGNOSIS — E11621 Type 2 diabetes mellitus with foot ulcer: Secondary | ICD-10-CM | POA: Diagnosis not present

## 2019-10-01 DIAGNOSIS — E7800 Pure hypercholesterolemia, unspecified: Secondary | ICD-10-CM | POA: Diagnosis not present

## 2019-10-01 DIAGNOSIS — E11610 Type 2 diabetes mellitus with diabetic neuropathic arthropathy: Secondary | ICD-10-CM | POA: Diagnosis not present

## 2019-10-10 DIAGNOSIS — M14672 Charcot's joint, left ankle and foot: Secondary | ICD-10-CM | POA: Diagnosis not present

## 2019-10-10 DIAGNOSIS — M129 Arthropathy, unspecified: Secondary | ICD-10-CM | POA: Diagnosis not present

## 2019-10-10 DIAGNOSIS — I10 Essential (primary) hypertension: Secondary | ICD-10-CM | POA: Diagnosis not present

## 2019-10-10 DIAGNOSIS — E11610 Type 2 diabetes mellitus with diabetic neuropathic arthropathy: Secondary | ICD-10-CM | POA: Diagnosis not present

## 2019-11-20 DIAGNOSIS — E7849 Other hyperlipidemia: Secondary | ICD-10-CM | POA: Diagnosis not present

## 2019-11-20 DIAGNOSIS — I10 Essential (primary) hypertension: Secondary | ICD-10-CM | POA: Diagnosis not present

## 2019-12-18 DIAGNOSIS — L03119 Cellulitis of unspecified part of limb: Secondary | ICD-10-CM | POA: Diagnosis not present

## 2019-12-20 DIAGNOSIS — E11610 Type 2 diabetes mellitus with diabetic neuropathic arthropathy: Secondary | ICD-10-CM | POA: Diagnosis not present

## 2019-12-20 DIAGNOSIS — I10 Essential (primary) hypertension: Secondary | ICD-10-CM | POA: Diagnosis not present

## 2019-12-23 DIAGNOSIS — L03119 Cellulitis of unspecified part of limb: Secondary | ICD-10-CM | POA: Diagnosis not present

## 2020-01-01 DIAGNOSIS — I82811 Embolism and thrombosis of superficial veins of right lower extremities: Secondary | ICD-10-CM | POA: Diagnosis not present

## 2020-01-02 DIAGNOSIS — M79661 Pain in right lower leg: Secondary | ICD-10-CM | POA: Diagnosis not present

## 2020-01-02 DIAGNOSIS — M7989 Other specified soft tissue disorders: Secondary | ICD-10-CM | POA: Diagnosis not present

## 2020-01-20 DIAGNOSIS — Z794 Long term (current) use of insulin: Secondary | ICD-10-CM | POA: Diagnosis not present

## 2020-01-20 DIAGNOSIS — E1165 Type 2 diabetes mellitus with hyperglycemia: Secondary | ICD-10-CM | POA: Diagnosis not present

## 2020-01-20 DIAGNOSIS — I10 Essential (primary) hypertension: Secondary | ICD-10-CM | POA: Diagnosis not present

## 2020-01-21 DIAGNOSIS — I82811 Embolism and thrombosis of superficial veins of right lower extremities: Secondary | ICD-10-CM | POA: Diagnosis not present

## 2020-01-22 ENCOUNTER — Other Ambulatory Visit: Payer: Self-pay | Admitting: Podiatry

## 2020-01-22 DIAGNOSIS — M7989 Other specified soft tissue disorders: Secondary | ICD-10-CM

## 2020-01-22 DIAGNOSIS — M79605 Pain in left leg: Secondary | ICD-10-CM

## 2020-01-23 ENCOUNTER — Ambulatory Visit (HOSPITAL_COMMUNITY)
Admission: RE | Admit: 2020-01-23 | Discharge: 2020-01-23 | Disposition: A | Discharge status: Home / Self Care | Payer: Medicare Other | Source: Ambulatory Visit | Attending: Podiatry | Admitting: Podiatry

## 2020-01-23 ENCOUNTER — Other Ambulatory Visit: Payer: Self-pay

## 2020-01-23 DIAGNOSIS — M7989 Other specified soft tissue disorders: Secondary | ICD-10-CM | POA: Diagnosis not present

## 2020-01-23 DIAGNOSIS — M79605 Pain in left leg: Secondary | ICD-10-CM | POA: Diagnosis not present

## 2020-01-23 DIAGNOSIS — I82441 Acute embolism and thrombosis of right tibial vein: Secondary | ICD-10-CM | POA: Diagnosis not present

## 2020-01-23 DIAGNOSIS — I824Z1 Acute embolism and thrombosis of unspecified deep veins of right distal lower extremity: Secondary | ICD-10-CM | POA: Diagnosis not present

## 2020-01-29 DIAGNOSIS — M14671 Charcot's joint, right ankle and foot: Secondary | ICD-10-CM | POA: Diagnosis not present

## 2020-01-29 DIAGNOSIS — I82461 Acute embolism and thrombosis of right calf muscular vein: Secondary | ICD-10-CM | POA: Diagnosis not present

## 2020-02-18 DIAGNOSIS — E1165 Type 2 diabetes mellitus with hyperglycemia: Secondary | ICD-10-CM | POA: Diagnosis not present

## 2020-02-18 DIAGNOSIS — Z794 Long term (current) use of insulin: Secondary | ICD-10-CM | POA: Diagnosis not present

## 2020-02-18 DIAGNOSIS — I10 Essential (primary) hypertension: Secondary | ICD-10-CM | POA: Diagnosis not present

## 2020-02-19 DIAGNOSIS — E1165 Type 2 diabetes mellitus with hyperglycemia: Secondary | ICD-10-CM | POA: Diagnosis not present

## 2020-02-19 DIAGNOSIS — L03119 Cellulitis of unspecified part of limb: Secondary | ICD-10-CM | POA: Diagnosis not present

## 2020-02-21 DIAGNOSIS — L03119 Cellulitis of unspecified part of limb: Secondary | ICD-10-CM | POA: Diagnosis not present

## 2020-02-21 DIAGNOSIS — E1165 Type 2 diabetes mellitus with hyperglycemia: Secondary | ICD-10-CM | POA: Diagnosis not present

## 2020-02-24 ENCOUNTER — Encounter (HOSPITAL_COMMUNITY): Payer: Self-pay

## 2020-02-24 ENCOUNTER — Other Ambulatory Visit: Payer: Self-pay

## 2020-02-24 ENCOUNTER — Emergency Department (HOSPITAL_COMMUNITY)
Admission: EM | Admit: 2020-02-24 | Discharge: 2020-02-24 | Disposition: A | Discharge status: Home / Self Care | Payer: Medicare Other | Attending: Emergency Medicine | Admitting: Emergency Medicine

## 2020-02-24 ENCOUNTER — Emergency Department (HOSPITAL_COMMUNITY): Payer: Medicare Other

## 2020-02-24 DIAGNOSIS — Z7901 Long term (current) use of anticoagulants: Secondary | ICD-10-CM | POA: Insufficient documentation

## 2020-02-24 DIAGNOSIS — M7989 Other specified soft tissue disorders: Secondary | ICD-10-CM | POA: Diagnosis not present

## 2020-02-24 DIAGNOSIS — I82402 Acute embolism and thrombosis of unspecified deep veins of left lower extremity: Secondary | ICD-10-CM | POA: Diagnosis not present

## 2020-02-24 DIAGNOSIS — E1122 Type 2 diabetes mellitus with diabetic chronic kidney disease: Secondary | ICD-10-CM | POA: Insufficient documentation

## 2020-02-24 DIAGNOSIS — M79604 Pain in right leg: Secondary | ICD-10-CM | POA: Diagnosis present

## 2020-02-24 DIAGNOSIS — L03115 Cellulitis of right lower limb: Secondary | ICD-10-CM

## 2020-02-24 DIAGNOSIS — E1140 Type 2 diabetes mellitus with diabetic neuropathy, unspecified: Secondary | ICD-10-CM | POA: Insufficient documentation

## 2020-02-24 DIAGNOSIS — N183 Chronic kidney disease, stage 3 unspecified: Secondary | ICD-10-CM | POA: Diagnosis not present

## 2020-02-24 DIAGNOSIS — I129 Hypertensive chronic kidney disease with stage 1 through stage 4 chronic kidney disease, or unspecified chronic kidney disease: Secondary | ICD-10-CM | POA: Insufficient documentation

## 2020-02-24 DIAGNOSIS — Z9889 Other specified postprocedural states: Secondary | ICD-10-CM | POA: Diagnosis not present

## 2020-02-24 LAB — CBC WITH DIFFERENTIAL/PLATELET
Abs Immature Granulocytes: 0.03 10*3/uL (ref 0.00–0.07)
Basophils Absolute: 0.1 10*3/uL (ref 0.0–0.1)
Basophils Relative: 1 %
Eosinophils Absolute: 0.4 10*3/uL (ref 0.0–0.5)
Eosinophils Relative: 4 %
HCT: 38.5 % (ref 36.0–46.0)
Hemoglobin: 12.2 g/dL (ref 12.0–15.0)
Immature Granulocytes: 0 %
Lymphocytes Relative: 20 %
Lymphs Abs: 1.7 10*3/uL (ref 0.7–4.0)
MCH: 29.7 pg (ref 26.0–34.0)
MCHC: 31.7 g/dL (ref 30.0–36.0)
MCV: 93.7 fL (ref 80.0–100.0)
Monocytes Absolute: 0.5 10*3/uL (ref 0.1–1.0)
Monocytes Relative: 6 %
Neutro Abs: 5.9 10*3/uL (ref 1.7–7.7)
Neutrophils Relative %: 69 %
Platelets: 368 10*3/uL (ref 150–400)
RBC: 4.11 MIL/uL (ref 3.87–5.11)
RDW: 13.4 % (ref 11.5–15.5)
WBC: 8.5 10*3/uL (ref 4.0–10.5)
nRBC: 0 % (ref 0.0–0.2)

## 2020-02-24 LAB — BASIC METABOLIC PANEL
Anion gap: 11 (ref 5–15)
BUN: 23 mg/dL — ABNORMAL HIGH (ref 6–20)
CO2: 26 mmol/L (ref 22–32)
Calcium: 9.2 mg/dL (ref 8.9–10.3)
Chloride: 101 mmol/L (ref 98–111)
Creatinine, Ser: 1.19 mg/dL — ABNORMAL HIGH (ref 0.44–1.00)
GFR calc Af Amer: 59 mL/min — ABNORMAL LOW (ref >60)
GFR calc non Af Amer: 51 mL/min — ABNORMAL LOW (ref >60)
Glucose, Bld: 256 mg/dL — ABNORMAL HIGH (ref 70–99)
Potassium: 4.8 mmol/L (ref 3.5–5.1)
Sodium: 138 mmol/L (ref 135–145)

## 2020-02-24 MED ORDER — VANCOMYCIN HCL IN DEXTROSE 1-5 GM/200ML-% IV SOLN
1000.0000 mg | Freq: Once | INTRAVENOUS | Status: AC
  Administered 2020-02-24: 1000 mg via INTRAVENOUS
  Filled 2020-02-24: qty 200

## 2020-02-24 NOTE — ED Triage Notes (Addendum)
Pt stated she has a reoccurring place on her right ankle of redness/ swelling/infection. Pt stated it became red on Weds. Pt is on a ABT from a previous Dr but believes this is making it worse.

## 2020-02-24 NOTE — Discharge Instructions (Addendum)
Elevate your leg when possible.  Warm compresses on and off.  He needs taking your clindamycin as directed.  Be sure to keep your appointment with your PCP tomorrow.  Return to the emergency department if you develop any worsening symptoms such as increasing pain, swelling, fever or chills.

## 2020-02-24 NOTE — ED Provider Notes (Signed)
[facility] EMERGENCY DEPARTMENT Provider Note   CSN: [id number] Arrival date & time: 02/24/20  0086     History Chief Complaint  Patient presents with  . Leg Pain    Katherine Peterson is a 54 y.o. female.  HPI      Katherine Peterson is a 54 y.o. female with past medical history of diabetes, diabetic neuropathy, Charcot's foot on the right and partial amputation of the left foot who presents to the Emergency Department complaining of a localized area of redness and pain to the right lower leg.  Symptoms have been present for nearly 1 week.  She reports a history of recurrent cellulitis of this area and DVT.  She was diagnosed with DVT in June of this year and started on Xarelto.  She was seen by her PCP last week and started on Augmentin she returned on Friday for recheck and symptoms appear to be worsening.  She was changed from Augmentin to clindamycin.  She presents to the emergency department due to increasing redness and concern for possible infection to her bone.  She states this is what caused her to lose part of her left foot.  Denies known injury, fever, chills, pain radiating up her leg.     Past Medical History:  Diagnosis Date  . Anemia   . Arthritis   . Cataract    left eye  . Chronic kidney disease    stage three-   . Constipation   . Depression   . Diabetes mellitus without complication ([place])   . Diabetic neuropathy ([place])   . Diverticulosis   . Foot fracture, right    dislocation  . GERD (gastroesophageal reflux disease)   . Head injury, closed, with brief LOC ([place]) 1998   low blood sugar- "passed out"  . 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 HOH (hard of hearing)   . Hypercholesterolemia   . Hypertension   . Leukocytosis 08/24/2016  . Microcytic hypochromic anemia 08/24/2016  . Neuropathy   . Pneumonia   . Shortness of breath dyspnea    short of breath - at times when I get out of bed at night    Patient Active Problem List   Diagnosis Date Noted    . Microcytic hypochromic anemia 08/24/2016  . Leukocytosis 08/24/2016  . Idiopathic chronic venous hypertension of right lower extremity without complications 76/19/5093  . Charcot foot due to diabetes mellitus ([place]) 07/01/2015    Past Surgical History:  Procedure Laterality Date  . ABDOMINAL HYSTERECTOMY    . amputation left foot    . CHOLECYSTECTOMY    . DIAGNOSTIC LAPAROSCOPY     small portion of colon  . DILATION AND CURETTAGE OF UTERUS    . EYE SURGERY Right    "for bleeding"  . I & D EXTREMITY Right 10/28/2015   Procedure: Right Foot Excision Medial Cuneiform, Wound Debridement, Place Wound VAC and Antibiotic Beads;  Surgeon: Newt Minion, MD;  Location: [place] [facility];  Service: Orthopedics;  Laterality: Right;  . OPEN REDUCTION INTERNAL FIXATION (ORIF) FOOT LISFRANC FRACTURE Right 07/01/2015   Procedure: OPEN REDUCTION INTERNAL FIXATION (ORIF) RIGHT FOOT LISFRANC FRACTURE/DISLOCATION;  Surgeon: Newt Minion, MD;  Location: [facility];  Service: Orthopedics;  Laterality: Right;  . SHOULDER SURGERY Left    I and D  . SKIN GRAFT     left  . TUBAL LIGATION       OB History   No obstetric history  on file.     Family History  Problem Relation Age of Onset  . Transient ischemic attack Mother   . Diabetes Mother   . Heart disease Father   . Cancer Sister     Social History   Tobacco Use  . Smoking status: Never Smoker  . Smokeless tobacco: Never Used  Substance Use Topics  . Alcohol use: No  . Drug use: No    Home Medications Prior to Admission medications   Medication Sig Start Date End Date Taking? Authorizing Provider  acetaminophen (TYLENOL) 500 MG tablet Take 1,000 mg by mouth every 6 (six) hours as needed for moderate pain.    [provider]  albuterol (PROVENTIL HFA;VENTOLIN HFA) 108 (90 Base) MCG/ACT inhaler Inhale 2 puffs into the lungs every 4 (four) hours as needed for wheezing or shortness of breath.    [provider]  aspirin EC 81 MG tablet  Take 81 mg by mouth daily.    [provider]  clonazePAM (KLONOPIN) 0.5 MG tablet Take 0.5 mg by mouth daily as needed for anxiety.    [provider]  escitalopram (LEXAPRO) 10 MG tablet Take 10 mg by mouth daily. 10/16/15   [provider]  insulin NPH Human (HUMULIN N,NOVOLIN N) 100 UNIT/ML injection Inject 50-80 Units into the skin 2 (two) times daily. Inject 50 units subque in the morning and 80 units subque in the evening    [provider]  losartan-hydrochlorothiazide (HYZAAR) 100-25 MG tablet Take 1 tablet by mouth daily.    [provider]  metFORMIN (GLUCOPHAGE-XR) 500 MG 24 hr tablet Take 500 mg by mouth daily with breakfast.    [provider]  metoprolol succinate (TOPROL-XL) 50 MG 24 hr tablet Take 50 mg by mouth daily. Take with or immediately following a meal.    [provider]  omeprazole (PRILOSEC OTC) 20 MG tablet Take 20 mg by mouth daily.    [provider]  pravastatin (PRAVACHOL) 40 MG tablet Take 40 mg by mouth daily.    [provider]    Allergies    Codeine and Keflex [cephalexin]  Review of Systems   Review of Systems  Constitutional: Negative for activity change, appetite change, chills and fever.  Respiratory: Negative for chest tightness and shortness of breath.   Gastrointestinal: Negative for abdominal pain, nausea and vomiting.  Musculoskeletal: Negative for neck pain and neck stiffness.  Skin: Positive for color change (redness of the right lower leg). Negative for rash and wound.  Neurological: Negative for dizziness, weakness, numbness and headaches.    Physical Exam Updated Vital Signs BP (!) 139/95 (BP Location: Right Arm)   Pulse 81   Temp 98.4 F (36.9 C) (Oral)   Resp 16   Ht 5\' 7"  (1.702 m)   Wt 109.8 kg   SpO2 97%   BMI 37.90 kg/m   Physical Exam Vitals and nursing note reviewed.  Constitutional:      Appearance: Normal appearance.  HENT:     Head:  Normocephalic.  Eyes:     Pupils: Pupils are equal, round, and reactive to light.  Neck:     Thyroid: No thyromegaly.     Meningeal: Kernig's sign absent.  Cardiovascular:     Rate and Rhythm: Normal rate and regular rhythm.     Pulses: Normal pulses.     Comments: faintly palpable dorsalis pedis pulse on the right.  Right dorsalis pedis and posterior tibial pulses easily heard with portable  Doppler. Pulmonary:     Effort: Pulmonary effort is normal.     Breath sounds: Normal breath sounds. No wheezing.  Abdominal:     Palpations: Abdomen is soft.     Tenderness: There is no abdominal tenderness. There is no guarding or rebound.  Musculoskeletal:        General: Normal range of motion.     Cervical back: Normal range of motion and neck supple.  Skin:    General: Skin is warm.     Capillary Refill: Capillary refill takes less than 2 seconds.     Findings: Erythema present. No rash.     Comments: Localized area of erythema and excessive warmth to the medial aspect of the right lower leg.  Erythema is not circumferential. no appreciable edema.  No lymphangitis.  Right calf is soft and nontender.  No palpable cord.  See attached photos  Neurological:     Mental Status: She is alert and oriented to person, place, and time.     Motor: No weakness.       ED Results / Procedures / Treatments   Labs (all labs ordered are listed, but only abnormal results are displayed) Labs Reviewed  BASIC METABOLIC PANEL - Abnormal; Notable for the following components:      Result Value   Glucose, Bld 256 (*)    BUN 23 (*)    Creatinine, Ser 1.19 (*)    GFR calc non Af Amer 51 (*)    GFR calc Af Amer 59 (*)    All other components within normal limits  CBC WITH DIFFERENTIAL/PLATELET    EKG None  Radiology DG Tibia/Fibula Right  Result Date: 02/24/2020 CLINICAL DATA:  Right ankle redness and swelling EXAM: RIGHT TIBIA AND FIBULA - 2 VIEW COMPARISON:  None. FINDINGS: Intact right tibia and  fibula. Healed fractures at the ankle. Fixation hardware in the midfoot region with chronic secondary degenerative changes of the ankle and midfoot. Lucency along the metallic bone interface may represent component of loosening. No definite focal soft tissue abnormality. IMPRESSION: No acute osseous finding or fracture. Chronic postsurgical and posttraumatic changes of the right ankle and foot. See above comment. Electronically Signed   By: Jerilynn Mages.  Shick M.D.   On: 02/24/2020 10:38    Procedures Procedures (including critical care time)  Medications Ordered in ED Medications  vancomycin (VANCOCIN) IVPB 1000 mg/200 mL premix ( Intravenous Stopped 02/24/20 1210)    ED Course  I have reviewed the triage vital signs and the nursing notes.  Pertinent labs & imaging results that were available during my care of the patient were reviewed by me and considered in my medical decision making (see chart for details).    MDM Rules/Calculators/A&P                          Patient here with recurrent cellulitis of the right lower leg.  Neurovascularly intact.  No radiographic evidence of osteomyelitis.  She is currently anticoagulated with Xarelto for a previously diagnosed DVT from June of this year. also taking oral clindamycin.  Well-appearing and nontoxic.  Vital signs reassuring.  She was given IV vancomycin here.  She has an appointment with her PCP tomorrow.  She will continue her oral clindamycin as directed.  Leading edge of erythema was marked.   Final Clinical Impression(s) / ED Diagnoses Final diagnoses:  Cellulitis of right lower extremity    Rx / DC Orders ED Discharge Orders  None       Kem Parkinson, PA-C 02/24/20 2223    Isla Pence, MD 02/25/20 [phone]

## 2020-02-25 DIAGNOSIS — L03115 Cellulitis of right lower limb: Secondary | ICD-10-CM | POA: Diagnosis not present

## 2020-02-27 ENCOUNTER — Emergency Department (HOSPITAL_COMMUNITY)
Admission: EM | Admit: 2020-02-27 | Discharge: 2020-02-27 | Disposition: A | Discharge status: Home / Self Care | Payer: Medicare Other | Attending: Emergency Medicine | Admitting: Emergency Medicine

## 2020-02-27 ENCOUNTER — Emergency Department (HOSPITAL_COMMUNITY): Payer: Medicare Other

## 2020-02-27 ENCOUNTER — Encounter (HOSPITAL_COMMUNITY): Payer: Self-pay | Admitting: Emergency Medicine

## 2020-02-27 ENCOUNTER — Other Ambulatory Visit: Payer: Self-pay

## 2020-02-27 DIAGNOSIS — Z7982 Long term (current) use of aspirin: Secondary | ICD-10-CM | POA: Diagnosis not present

## 2020-02-27 DIAGNOSIS — L03115 Cellulitis of right lower limb: Secondary | ICD-10-CM | POA: Diagnosis not present

## 2020-02-27 DIAGNOSIS — L989 Disorder of the skin and subcutaneous tissue, unspecified: Secondary | ICD-10-CM | POA: Diagnosis not present

## 2020-02-27 DIAGNOSIS — Z794 Long term (current) use of insulin: Secondary | ICD-10-CM | POA: Diagnosis not present

## 2020-02-27 DIAGNOSIS — N183 Chronic kidney disease, stage 3 unspecified: Secondary | ICD-10-CM | POA: Diagnosis not present

## 2020-02-27 DIAGNOSIS — E119 Type 2 diabetes mellitus without complications: Secondary | ICD-10-CM | POA: Diagnosis not present

## 2020-02-27 DIAGNOSIS — M79606 Pain in leg, unspecified: Secondary | ICD-10-CM | POA: Diagnosis present

## 2020-02-27 DIAGNOSIS — I129 Hypertensive chronic kidney disease with stage 1 through stage 4 chronic kidney disease, or unspecified chronic kidney disease: Secondary | ICD-10-CM | POA: Diagnosis not present

## 2020-02-27 DIAGNOSIS — Z79899 Other long term (current) drug therapy: Secondary | ICD-10-CM | POA: Insufficient documentation

## 2020-02-27 DIAGNOSIS — M6258 Muscle wasting and atrophy, not elsewhere classified, other site: Secondary | ICD-10-CM | POA: Diagnosis not present

## 2020-02-27 DIAGNOSIS — M7989 Other specified soft tissue disorders: Secondary | ICD-10-CM | POA: Diagnosis not present

## 2020-02-27 DIAGNOSIS — M19071 Primary osteoarthritis, right ankle and foot: Secondary | ICD-10-CM | POA: Diagnosis not present

## 2020-02-27 LAB — CBC WITH DIFFERENTIAL/PLATELET
Abs Immature Granulocytes: 0.06 10*3/uL (ref 0.00–0.07)
Basophils Absolute: 0.1 10*3/uL (ref 0.0–0.1)
Basophils Relative: 1 %
Eosinophils Absolute: 0.5 10*3/uL (ref 0.0–0.5)
Eosinophils Relative: 5 %
HCT: 39.3 % (ref 36.0–46.0)
Hemoglobin: 12.5 g/dL (ref 12.0–15.0)
Immature Granulocytes: 1 %
Lymphocytes Relative: 31 %
Lymphs Abs: 2.7 10*3/uL (ref 0.7–4.0)
MCH: 29.8 pg (ref 26.0–34.0)
MCHC: 31.8 g/dL (ref 30.0–36.0)
MCV: 93.6 fL (ref 80.0–100.0)
Monocytes Absolute: 0.4 10*3/uL (ref 0.1–1.0)
Monocytes Relative: 5 %
Neutro Abs: 5.1 10*3/uL (ref 1.7–7.7)
Neutrophils Relative %: 57 %
Platelets: 430 10*3/uL — ABNORMAL HIGH (ref 150–400)
RBC: 4.2 MIL/uL (ref 3.87–5.11)
RDW: 13.3 % (ref 11.5–15.5)
WBC: 8.8 10*3/uL (ref 4.0–10.5)
nRBC: 0 % (ref 0.0–0.2)

## 2020-02-27 LAB — BASIC METABOLIC PANEL
Anion gap: 11 (ref 5–15)
BUN: 22 mg/dL — ABNORMAL HIGH (ref 6–20)
CO2: 25 mmol/L (ref 22–32)
Calcium: 9.2 mg/dL (ref 8.9–10.3)
Chloride: 103 mmol/L (ref 98–111)
Creatinine, Ser: 1.13 mg/dL — ABNORMAL HIGH (ref 0.44–1.00)
GFR calc Af Amer: >60 mL/min (ref >60)
GFR calc non Af Amer: 54 mL/min — ABNORMAL LOW (ref >60)
Glucose, Bld: 116 mg/dL — ABNORMAL HIGH (ref 70–99)
Potassium: 4.1 mmol/L (ref 3.5–5.1)
Sodium: 139 mmol/L (ref 135–145)

## 2020-02-27 LAB — CBG MONITORING, ED: Glucose-Capillary: 51 mg/dL — ABNORMAL LOW (ref 70–99)

## 2020-02-27 MED ORDER — VANCOMYCIN HCL IN DEXTROSE 1-5 GM/200ML-% IV SOLN
1000.0000 mg | Freq: Once | INTRAVENOUS | Status: AC
  Administered 2020-02-27: 1000 mg via INTRAVENOUS
  Filled 2020-02-27: qty 200

## 2020-02-27 MED ORDER — IOHEXOL 300 MG/ML  SOLN
100.0000 mL | Freq: Once | INTRAMUSCULAR | Status: AC | PRN
  Administered 2020-02-27: 100 mL via INTRAVENOUS

## 2020-02-27 MED ORDER — SULFAMETHOXAZOLE-TRIMETHOPRIM 800-160 MG PO TABS: 1.0000 | ORAL_TABLET | Freq: Two times a day (BID) | ORAL | 0 refills | Status: AC

## 2020-02-27 NOTE — ED Provider Notes (Signed)
[facility] EMERGENCY DEPARTMENT Provider Note   CSN: [id number] Arrival date & time: 02/27/20  1146     History Chief Complaint  Patient presents with  . Leg Pain    Katherine Peterson is a 54 y.o. female.  HPI   This patient is a 54 year old female, she has a known history of diabetes, she also has what appears to be a Charcot foot and has been followed by podiatry over time.  She has had surgery on that side, most recently in 2015 where she did require some hardware.  She has had infected hardware, she has had multiple signs of infection on that foot intermittently over the years.  She reports that despite being on multiple courses of antibiotics she is still having redness and tenderness and swelling on the distal anterior aspect of her leg, just above the foot.  There is no redness or tenderness to the foot.  The area is tender, it is not associated with subjective fevers and she has not had any diaphoresis.  She has been on courses of Augmentin initially followed by doxycycline, followed by Levaquin that was ordered by her podiatrist, followed by clindamycin that was ordered 3 days ago in the emergency department when the patient was seen and given vancomycin intravenously.  Clindamycin has been taken for the last several days and though she states that initially started to retract it is now started to become worsened, more red and more tender.  She does note some associated hyperglycemia with her sugars ranging up above 200, her last A1c was 8  Past Medical History:  Diagnosis Date  . Anemia   . Arthritis   . Cataract    left eye  . Chronic kidney disease    stage three-   . Constipation   . Depression   . Diabetes mellitus without complication ([place])   . Diabetic neuropathy ([place])   . Diverticulosis   . Foot fracture, right    dislocation  . GERD (gastroesophageal reflux disease)   . Head injury, closed, with brief LOC ([place]) 1998   low blood sugar- "passed out"  . Hearing  impaired    deaf in right and HOH in left  . HOH (hard of hearing)   . Hypercholesterolemia   . Hypertension   . Leukocytosis 08/24/2016  . Microcytic hypochromic anemia 08/24/2016  . Neuropathy   . Pneumonia   . Shortness of breath dyspnea    short of breath - at times when I get out of bed at night    Patient Active Problem List   Diagnosis Date Noted  . Microcytic hypochromic anemia 08/24/2016  . Leukocytosis 08/24/2016  . Idiopathic chronic venous hypertension of right lower extremity without complications 65/46/5035  . Charcot foot due to diabetes mellitus ([place]) 07/01/2015    Past Surgical History:  Procedure Laterality Date  . ABDOMINAL HYSTERECTOMY    . amputation left foot    . CHOLECYSTECTOMY    . DIAGNOSTIC LAPAROSCOPY     small portion of colon  . DILATION AND CURETTAGE OF UTERUS    . EYE SURGERY Right    "for bleeding"  . I & D EXTREMITY Right 10/28/2015   Procedure: Right Foot Excision Medial Cuneiform, Wound Debridement, Place Wound VAC and Antibiotic Beads;  Surgeon: Newt Minion, MD;  Location: [place];  Service: Orthopedics;  Laterality: Right;  . OPEN REDUCTION INTERNAL FIXATION (ORIF) FOOT LISFRANC FRACTURE Right 07/01/2015   Procedure: OPEN REDUCTION INTERNAL FIXATION (ORIF) RIGHT FOOT LISFRANC  FRACTURE/DISLOCATION;  Surgeon: Newt Minion, MD;  Location: [place];  Service: Orthopedics;  Laterality: Right;  . SHOULDER SURGERY Left    I and D  . SKIN GRAFT     left  . TUBAL LIGATION       OB History   No obstetric history on file.     Family History  Problem Relation Age of Onset  . Transient ischemic attack Mother   . Diabetes Mother   . Heart disease Father   . Cancer Sister     Social History   Tobacco Use  . Smoking status: Never Smoker  . Smokeless tobacco: Never Used  Substance Use Topics  . Alcohol use: No  . Drug use: No    Home Medications Prior to Admission medications   Medication Sig Start Date End Date Taking? Authorizing  Provider  acetaminophen (TYLENOL) 500 MG tablet Take 1,000 mg by mouth every 6 (six) hours as needed for moderate pain.    [provider]  albuterol (PROVENTIL HFA;VENTOLIN HFA) 108 (90 Base) MCG/ACT inhaler Inhale 2 puffs into the lungs every 4 (four) hours as needed for wheezing or shortness of breath.    [provider]  aspirin EC 81 MG tablet Take 81 mg by mouth daily.    [provider]  clonazePAM (KLONOPIN) 0.5 MG tablet Take 0.5 mg by mouth daily as needed for anxiety.    [provider]  escitalopram (LEXAPRO) 10 MG tablet Take 10 mg by mouth daily. 10/16/15   [provider]  insulin NPH Human (HUMULIN N,NOVOLIN N) 100 UNIT/ML injection Inject 50-80 Units into the skin 2 (two) times daily. Inject 50 units subque in the morning and 80 units subque in the evening    [provider]  losartan-hydrochlorothiazide (HYZAAR) 100-25 MG tablet Take 1 tablet by mouth daily.    [provider]  metFORMIN (GLUCOPHAGE-XR) 500 MG 24 hr tablet Take 500 mg by mouth daily with breakfast.    [provider]  metoprolol succinate (TOPROL-XL) 50 MG 24 hr tablet Take 50 mg by mouth daily. Take with or immediately following a meal.    [provider]  omeprazole (PRILOSEC OTC) 20 MG tablet Take 20 mg by mouth daily.    [provider]  pravastatin (PRAVACHOL) 40 MG tablet Take 40 mg by mouth daily.    [provider]  sulfamethoxazole-trimethoprim (BACTRIM DS) 800-160 MG tablet Take 1 tablet by mouth 2 (two) times daily for 10 days. 02/27/20 03/08/20  Noemi Chapel, MD    Allergies    Codeine and Keflex [cephalexin]  Review of Systems   Review of Systems  All other systems reviewed and are negative.   Physical Exam Updated Vital Signs BP (!) 155/74   Pulse 91   Temp 98.3 F (36.8 C) (Oral)   Resp 16   Ht 1.702 m (5\' 7" )   Wt 109.8 kg   SpO2 97%   BMI 37.90 kg/m   Physical Exam Vitals and nursing  note reviewed.  Constitutional:      General: She is not in acute distress.    Appearance: She is well-developed.  HENT:     Head: Normocephalic and atraumatic.     Mouth/Throat:     Pharynx: No oropharyngeal exudate.  Eyes:     General: No scleral icterus.       Right eye: No discharge.        Left eye: No discharge.     Conjunctiva/sclera:  Conjunctivae normal.     Pupils: Pupils are equal, round, and reactive to light.  Neck:     Thyroid: No thyromegaly.     Vascular: No JVD.  Cardiovascular:     Rate and Rhythm: Normal rate and regular rhythm.     Heart sounds: Normal heart sounds. No murmur heard.  No friction rub. No gallop.   Pulmonary:     Effort: Pulmonary effort is normal. No respiratory distress.     Breath sounds: Normal breath sounds. No wheezing or rales.  Abdominal:     General: Bowel sounds are normal. There is no distension.     Palpations: Abdomen is soft. There is no mass.     Tenderness: There is no abdominal tenderness.  Musculoskeletal:        General: No tenderness. Normal range of motion.     Cervical back: Normal range of motion and neck supple.     Comments: Charcot foot deformity  Lymphadenopathy:     Cervical: No cervical adenopathy.  Skin:    General: Skin is warm and dry.     Findings: Erythema present. No rash.     Comments: Distal lower extremity with redness warmth and tenderness  Neurological:     Mental Status: She is alert.     Coordination: Coordination normal.     Comments: Hard of hearing, otherwise unremarkable neurologic exam  Psychiatric:        Behavior: Behavior normal.     ED Results / Procedures / Treatments   Labs (all labs ordered are listed, but only abnormal results are displayed) Labs Reviewed  CBC WITH DIFFERENTIAL/PLATELET - Abnormal; Notable for the following components:      Result Value   Platelets 430 (*)    All other components within normal limits  BASIC METABOLIC PANEL - Abnormal; Notable for the  following components:   Glucose, Bld 116 (*)    BUN 22 (*)    Creatinine, Ser 1.13 (*)    GFR calc non Af Amer 54 (*)    All other components within normal limits  CBG MONITORING, ED - Abnormal; Notable for the following components:   Glucose-Capillary 51 (*)    All other components within normal limits    EKG None  Radiology CT TIBIA FIBULA RIGHT W CONTRAST  Result Date: 02/27/2020 CLINICAL DATA:  Lower leg swelling and redness for 2 months. Soft tissue infection suspected EXAM: CT OF THE LOWER RIGHT EXTREMITY WITH CONTRAST TECHNIQUE: Multidetector CT imaging of the lower right extremity was performed according to the standard protocol following intravenous contrast administration. COMPARISON:  X-ray 02/24/2020 CONTRAST:  167mL OMNIPAQUE IOHEXOL 300 MG/ML  SOLN FINDINGS: Bones/Joint/Cartilage No acute fracture. No dislocation. No cortical destruction. No evidence of periosteal reaction. Advanced arthropathy of the ankle and hindfoot including chronic fragmentation of the inferior tip of the lateral malleolus. No knee joint effusion. No tibiotalar joint effusion. Ligaments Suboptimally assessed by CT. Muscles and Tendons Marked atrophy and fatty infiltration of the lower leg musculature, most pronounced within the posterior compartment. No evidence of acute tendinous abnormality by CT. Soft tissues Mild skin thickening overlies the lateral malleolus and dorsal midfoot. Minimal soft tissue induration/edema anteriorly at the level of the distal tibial metadiaphysis. No discrete soft tissue ulceration. No soft tissue fluid collection. No soft tissue gas. IMPRESSION: 1. No acute osseous abnormality of the right tibia or fibula. No CT evidence of acute osteomyelitis. 2. Mild skin thickening overlies the lateral malleolus and dorsal midfoot. Minimal soft  tissue induration/edema anteriorly at the level of the distal tibial metadiaphysis. No discrete soft tissue ulceration. No soft tissue fluid collection.  3. Chronic advanced arthropathy of the ankle and hindfoot. 4. Marked atrophy and fatty infiltration of the lower leg musculature, most pronounced within the posterior compartment. Findings likely secondary to chronic denervation. Electronically Signed   By: Davina Poke D.O.   On: 02/27/2020 15:05    Procedures Procedures (including critical care time)  Medications Ordered in ED Medications  vancomycin (VANCOCIN) IVPB 1000 mg/200 mL premix (1,000 mg Intravenous Incomplete 02/27/20 1522)  iohexol (OMNIPAQUE) 300 MG/ML solution 100 mL (100 mLs Intravenous Contrast Given 02/27/20 1428)    ED Course  I have reviewed the triage vital signs and the nursing notes.  Pertinent labs & imaging results that were available during my care of the patient were reviewed by me and considered in my medical decision making (see chart for details).    MDM Rules/Calculators/A&P                          I feel no subcutaneous emphysema, there is no crepitance, she does have warmth and tenderness suggestive of the tissue infection.  This appears to be more than the stasis dermatitis.  At this time the patient will need an MRI or CT to further evaluate for deep tissue infection, she may need ongoing antibiotics though clindamycin does not have as good of coverage as Bactrim and this area based on hospital antibiogram   CT negative, labs normal, pt stable and well appaering Sugar dropped to 51, eating and asymptomatic at this time Vancomycin given, change to bactrim - pt agreeable Close f/u.  No signs of osteomyelitis or sepsis  Final Clinical Impression(s) / ED Diagnoses Final diagnoses:  Cellulitis of right lower extremity    Rx / DC Orders ED Discharge Orders         Ordered    sulfamethoxazole-trimethoprim (BACTRIM DS) 800-160 MG tablet  2 times daily     Discontinue  Reprint     02/27/20 1533           Noemi Chapel, MD 02/27/20 1535

## 2020-02-27 NOTE — ED Notes (Signed)
States she has had swelling of leg for the past 4 months. States leg is not worse but has not gotten better since prior visit.

## 2020-02-27 NOTE — Discharge Instructions (Signed)
Please stop taking clindamycin, instead take Bactrim 1 tablet twice a day for the 10 days Emergency department for severe worsening pain redness swelling or fever Please see your podiatrist or family doctor within the week and take a picture of your leg every day to compare how this is getting better or worse.

## 2020-02-27 NOTE — ED Notes (Signed)
Pt given apple juice, peanut butter crackers, and a sandwich bag for her blood sugar at this time per Dr. Langston Masker approval.

## 2020-02-27 NOTE — ED Triage Notes (Signed)
Pt c/o continued right leg and ankle redness/swelling. Pt seen here for the same on 02/24/20.

## 2020-03-04 DIAGNOSIS — E11621 Type 2 diabetes mellitus with foot ulcer: Secondary | ICD-10-CM | POA: Diagnosis not present

## 2020-03-04 DIAGNOSIS — E1165 Type 2 diabetes mellitus with hyperglycemia: Secondary | ICD-10-CM | POA: Diagnosis not present

## 2020-03-04 DIAGNOSIS — E11610 Type 2 diabetes mellitus with diabetic neuropathic arthropathy: Secondary | ICD-10-CM | POA: Diagnosis not present

## 2020-03-04 DIAGNOSIS — E7800 Pure hypercholesterolemia, unspecified: Secondary | ICD-10-CM | POA: Diagnosis not present

## 2020-03-05 DIAGNOSIS — M79671 Pain in right foot: Secondary | ICD-10-CM | POA: Diagnosis not present

## 2020-03-05 DIAGNOSIS — I82811 Embolism and thrombosis of superficial veins of right lower extremities: Secondary | ICD-10-CM | POA: Diagnosis not present

## 2020-03-11 DIAGNOSIS — I10 Essential (primary) hypertension: Secondary | ICD-10-CM | POA: Diagnosis not present

## 2020-03-11 DIAGNOSIS — M14672 Charcot's joint, left ankle and foot: Secondary | ICD-10-CM | POA: Diagnosis not present

## 2020-03-11 DIAGNOSIS — Z0001 Encounter for general adult medical examination with abnormal findings: Secondary | ICD-10-CM | POA: Diagnosis not present

## 2020-03-11 DIAGNOSIS — E11610 Type 2 diabetes mellitus with diabetic neuropathic arthropathy: Secondary | ICD-10-CM | POA: Diagnosis not present

## 2020-03-19 ENCOUNTER — Other Ambulatory Visit: Payer: Self-pay

## 2020-03-19 ENCOUNTER — Encounter: Payer: Self-pay | Admitting: Internal Medicine

## 2020-03-19 ENCOUNTER — Ambulatory Visit: Payer: Medicare Other | Admitting: Internal Medicine

## 2020-03-19 VITALS — HR 101

## 2020-03-19 DIAGNOSIS — I87301 Chronic venous hypertension (idiopathic) without complications of right lower extremity: Secondary | ICD-10-CM

## 2020-03-23 ENCOUNTER — Encounter: Payer: Self-pay | Admitting: Internal Medicine

## 2020-03-23 NOTE — Progress Notes (Signed)
[facility] for Infectious Disease      Reason for Consult: skin erythema    Referring Physician: Dr. Irving Shows    Patient ID: Katherine Peterson, female    DOB: Jun 25, 1966, 54 y.o.   MRN: [id number]  HPI:   She is here for evaluation of a non-resolving erythematous patch on her leg.  This has been present for several months.  She first noted it in about May and has seen her podiatrist and PCP for this.  She has been on multiple courses of antibiotics for this with no resolution.  She was diagnosed with a DVT as part of the work up and is now on Xarelto.  She describes this as intermittent erythema that is edema dependent.  It has been hot and tender in the past.  She notes spread up her calf that comes and goes.  No current pain, significant swelling or fever.  She has taken Augmentin, clindamycin, levofloxacin and IV vancomycin during an ED visit.  Previous record reviewed in Epic and from Dr. Irving Shows and partially summarized above. No leukocytosis in her recent ED visit.   Past Medical History:  Diagnosis Date  . Anemia   . Arthritis   . Cataract    left eye  . Chronic kidney disease    stage three-   . Constipation   . Depression   . Diabetes mellitus without complication ([place])   . Diabetic neuropathy ([place])   . Diverticulosis   . Foot fracture, right    dislocation  . GERD (gastroesophageal reflux disease)   . Head injury, closed, with brief LOC ([place]) 1998   low blood sugar- "passed out"  . 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 HOH (hard of hearing)   . Hypercholesterolemia   . Hypertension   . Leukocytosis 08/24/2016  . Microcytic hypochromic anemia 08/24/2016  . Neuropathy   . Pneumonia   . Shortness of breath dyspnea    short of breath - at times when I get out of bed at night    Prior to Admission medications   Medication Sig Start Date End Date Taking? Authorizing Provider  acetaminophen (TYLENOL) 500 MG tablet Take 1,000 mg by mouth every 6 (six) hours as  needed for moderate pain.   Yes [provider]  escitalopram (LEXAPRO) 10 MG tablet Take 10 mg by mouth daily. 10/16/15  Yes [provider]  gabapentin (NEURONTIN) 400 MG capsule Take 800 mg by mouth at bedtime. 02/13/20  Yes [provider]  insulin NPH Human (HUMULIN N,NOVOLIN N) 100 UNIT/ML injection Inject 50-80 Units into the skin 2 (two) times daily. Inject 50 units subque in the morning and 80 units subque in the evening   Yes [provider]  losartan-hydrochlorothiazide (HYZAAR) 100-25 MG tablet Take 1 tablet by mouth daily.   Yes [provider]  metFORMIN (GLUCOPHAGE-XR) 500 MG 24 hr tablet Take 500 mg by mouth daily with breakfast.   Yes [provider]  pravastatin (PRAVACHOL) 40 MG tablet Take 40 mg by mouth daily.   Yes [provider]  albuterol (PROVENTIL HFA;VENTOLIN HFA) 108 (90 Base) MCG/ACT inhaler Inhale 2 puffs into the lungs every 4 (four) hours as needed for wheezing or shortness of breath.    [provider]  aspirin EC 81 MG tablet Take 81 mg by mouth daily. Patient not taking: Reported on 03/19/2020    [provider]  clonazePAM (KLONOPIN) 0.5 MG tablet  Take 0.5 mg by mouth daily as needed for anxiety. Patient not taking: Reported on 03/19/2020    [provider]  metoprolol succinate (TOPROL-XL) 50 MG 24 hr tablet Take 50 mg by mouth daily. Take with or immediately following a meal. Patient not taking: Reported on 03/19/2020    [provider]  omeprazole (PRILOSEC OTC) 20 MG tablet Take 20 mg by mouth daily. Patient not taking: Reported on 03/19/2020    [provider]    Allergies  Allergen Reactions  . Codeine Hives  . Keflex [Cephalexin] Other (See Comments)    Sick to stomach    Social History   Tobacco Use  . Smoking status: Never Smoker  . Smokeless tobacco: Never Used  Substance Use Topics  . Alcohol use: No  . Drug use: No    Family History    Problem Relation Age of Onset  . Transient ischemic attack Mother   . Diabetes Mother   . Heart disease Father   . Cancer Sister     Review of Systems  Constitutional: negative for fevers and chills Integument/breast: negative for rash All other systems reviewed and are negative    Constitutional: in no apparent distress  Vitals:   03/19/20 0942  Pulse: 101  SpO2: 94%   EYES: anicteric Musculoskeletal: right leg with an erythematous patch, blanching, no warmth, no edema, no tenderness.   Skin: no rashes otherwise  Labs: Lab Results  Component Value Date   WBC 8.8 02/27/2020   HGB 12.5 02/27/2020   HCT 39.3 02/27/2020   MCV 93.6 02/27/2020   PLT 430 (H) 02/27/2020    Lab Results  Component Value Date   CREATININE 1.13 (H) 02/27/2020   BUN 22 (H) 02/27/2020   NA 139 02/27/2020   K 4.1 02/27/2020   CL 103 02/27/2020   CO2 25 02/27/2020    Lab Results  Component Value Date   ALT 12 (L) 10/14/2016   AST 15 10/14/2016   ALKPHOS 86 10/14/2016   BILITOT 0.2 (L) 10/14/2016   INR 1.00 10/28/2015     Assessment: blanching erythematous rash on leg.  It is not c/w cellulitis with no warmth, tenderness and blanching.  No further antibiotics indicated.  She may have previously had cellulitis and treated appropriately.  If erythema persists, she could consider going to a dermatologist for a biopsy.     Plan: 1) stop antibiotics Consider dermatology referral if it persists and patient wants a diagnosis.   Thanks for referral, call with questions.

## 2020-04-29 DIAGNOSIS — E113513 Type 2 diabetes mellitus with proliferative diabetic retinopathy with macular edema, bilateral: Secondary | ICD-10-CM | POA: Diagnosis not present

## 2020-04-29 DIAGNOSIS — H26493 Other secondary cataract, bilateral: Secondary | ICD-10-CM | POA: Diagnosis not present

## 2020-05-15 DIAGNOSIS — E113512 Type 2 diabetes mellitus with proliferative diabetic retinopathy with macular edema, left eye: Secondary | ICD-10-CM | POA: Diagnosis not present

## 2020-05-29 DIAGNOSIS — E113513 Type 2 diabetes mellitus with proliferative diabetic retinopathy with macular edema, bilateral: Secondary | ICD-10-CM | POA: Diagnosis not present

## 2020-06-20 DIAGNOSIS — Z794 Long term (current) use of insulin: Secondary | ICD-10-CM | POA: Diagnosis not present

## 2020-06-20 DIAGNOSIS — I10 Essential (primary) hypertension: Secondary | ICD-10-CM | POA: Diagnosis not present

## 2020-06-20 DIAGNOSIS — E1165 Type 2 diabetes mellitus with hyperglycemia: Secondary | ICD-10-CM | POA: Diagnosis not present

## 2020-07-29 DIAGNOSIS — E113592 Type 2 diabetes mellitus with proliferative diabetic retinopathy without macular edema, left eye: Secondary | ICD-10-CM | POA: Diagnosis not present

## 2020-07-29 DIAGNOSIS — H26493 Other secondary cataract, bilateral: Secondary | ICD-10-CM | POA: Diagnosis not present

## 2020-07-29 DIAGNOSIS — H31093 Other chorioretinal scars, bilateral: Secondary | ICD-10-CM | POA: Diagnosis not present

## 2020-07-29 DIAGNOSIS — E113511 Type 2 diabetes mellitus with proliferative diabetic retinopathy with macular edema, right eye: Secondary | ICD-10-CM | POA: Diagnosis not present

## 2020-08-21 DIAGNOSIS — E1165 Type 2 diabetes mellitus with hyperglycemia: Secondary | ICD-10-CM | POA: Diagnosis not present

## 2020-08-21 DIAGNOSIS — I10 Essential (primary) hypertension: Secondary | ICD-10-CM | POA: Diagnosis not present

## 2020-08-21 DIAGNOSIS — Z794 Long term (current) use of insulin: Secondary | ICD-10-CM | POA: Diagnosis not present

## 2020-08-24 DIAGNOSIS — H40013 Open angle with borderline findings, low risk, bilateral: Secondary | ICD-10-CM | POA: Diagnosis not present

## 2020-08-24 DIAGNOSIS — E113513 Type 2 diabetes mellitus with proliferative diabetic retinopathy with macular edema, bilateral: Secondary | ICD-10-CM | POA: Diagnosis not present

## 2020-08-24 DIAGNOSIS — H26493 Other secondary cataract, bilateral: Secondary | ICD-10-CM | POA: Diagnosis not present

## 2020-08-24 DIAGNOSIS — H40052 Ocular hypertension, left eye: Secondary | ICD-10-CM | POA: Diagnosis not present

## 2020-09-04 DIAGNOSIS — L97509 Non-pressure chronic ulcer of other part of unspecified foot with unspecified severity: Secondary | ICD-10-CM | POA: Diagnosis not present

## 2020-09-04 DIAGNOSIS — E11621 Type 2 diabetes mellitus with foot ulcer: Secondary | ICD-10-CM | POA: Diagnosis not present

## 2020-09-10 DIAGNOSIS — L97521 Non-pressure chronic ulcer of other part of left foot limited to breakdown of skin: Secondary | ICD-10-CM | POA: Diagnosis not present

## 2020-09-16 DIAGNOSIS — Z8631 Personal history of diabetic foot ulcer: Secondary | ICD-10-CM | POA: Diagnosis not present

## 2020-09-16 DIAGNOSIS — M62572 Muscle wasting and atrophy, not elsewhere classified, left ankle and foot: Secondary | ICD-10-CM | POA: Diagnosis not present

## 2020-09-16 DIAGNOSIS — S91302A Unspecified open wound, left foot, initial encounter: Secondary | ICD-10-CM | POA: Diagnosis not present

## 2020-09-16 DIAGNOSIS — Z89422 Acquired absence of other left toe(s): Secondary | ICD-10-CM | POA: Diagnosis not present

## 2020-09-19 DIAGNOSIS — I10 Essential (primary) hypertension: Secondary | ICD-10-CM | POA: Diagnosis not present

## 2020-09-19 DIAGNOSIS — Z794 Long term (current) use of insulin: Secondary | ICD-10-CM | POA: Diagnosis not present

## 2020-09-19 DIAGNOSIS — E1165 Type 2 diabetes mellitus with hyperglycemia: Secondary | ICD-10-CM | POA: Diagnosis not present

## 2020-09-22 DIAGNOSIS — L97521 Non-pressure chronic ulcer of other part of left foot limited to breakdown of skin: Secondary | ICD-10-CM | POA: Diagnosis not present

## 2020-09-23 DIAGNOSIS — H40013 Open angle with borderline findings, low risk, bilateral: Secondary | ICD-10-CM | POA: Diagnosis not present

## 2020-10-13 DIAGNOSIS — L97521 Non-pressure chronic ulcer of other part of left foot limited to breakdown of skin: Secondary | ICD-10-CM | POA: Diagnosis not present

## 2020-10-14 DIAGNOSIS — H26491 Other secondary cataract, right eye: Secondary | ICD-10-CM | POA: Diagnosis not present

## 2020-10-14 DIAGNOSIS — E113513 Type 2 diabetes mellitus with proliferative diabetic retinopathy with macular edema, bilateral: Secondary | ICD-10-CM | POA: Diagnosis not present

## 2020-10-14 DIAGNOSIS — H40013 Open angle with borderline findings, low risk, bilateral: Secondary | ICD-10-CM | POA: Diagnosis not present

## 2020-11-03 DIAGNOSIS — L97321 Non-pressure chronic ulcer of left ankle limited to breakdown of skin: Secondary | ICD-10-CM | POA: Diagnosis not present

## 2020-11-18 DIAGNOSIS — Z794 Long term (current) use of insulin: Secondary | ICD-10-CM | POA: Diagnosis not present

## 2020-11-18 DIAGNOSIS — E1165 Type 2 diabetes mellitus with hyperglycemia: Secondary | ICD-10-CM | POA: Diagnosis not present

## 2020-11-18 DIAGNOSIS — I10 Essential (primary) hypertension: Secondary | ICD-10-CM | POA: Diagnosis not present

## 2020-11-20 DIAGNOSIS — E113511 Type 2 diabetes mellitus with proliferative diabetic retinopathy with macular edema, right eye: Secondary | ICD-10-CM | POA: Diagnosis not present

## 2020-11-20 DIAGNOSIS — E113592 Type 2 diabetes mellitus with proliferative diabetic retinopathy without macular edema, left eye: Secondary | ICD-10-CM | POA: Diagnosis not present

## 2020-11-20 DIAGNOSIS — H31093 Other chorioretinal scars, bilateral: Secondary | ICD-10-CM | POA: Diagnosis not present

## 2020-11-20 DIAGNOSIS — H26493 Other secondary cataract, bilateral: Secondary | ICD-10-CM | POA: Diagnosis not present

## 2020-11-30 DIAGNOSIS — I10 Essential (primary) hypertension: Secondary | ICD-10-CM | POA: Diagnosis not present

## 2020-11-30 DIAGNOSIS — E1165 Type 2 diabetes mellitus with hyperglycemia: Secondary | ICD-10-CM | POA: Diagnosis not present

## 2020-11-30 DIAGNOSIS — Z1329 Encounter for screening for other suspected endocrine disorder: Secondary | ICD-10-CM | POA: Diagnosis not present

## 2020-11-30 DIAGNOSIS — K219 Gastro-esophageal reflux disease without esophagitis: Secondary | ICD-10-CM | POA: Diagnosis not present

## 2020-12-01 DIAGNOSIS — E11621 Type 2 diabetes mellitus with foot ulcer: Secondary | ICD-10-CM | POA: Diagnosis not present

## 2020-12-01 DIAGNOSIS — E1169 Type 2 diabetes mellitus with other specified complication: Secondary | ICD-10-CM | POA: Diagnosis not present

## 2020-12-01 DIAGNOSIS — I152 Hypertension secondary to endocrine disorders: Secondary | ICD-10-CM | POA: Diagnosis not present

## 2020-12-01 DIAGNOSIS — E1122 Type 2 diabetes mellitus with diabetic chronic kidney disease: Secondary | ICD-10-CM | POA: Diagnosis not present

## 2020-12-01 DIAGNOSIS — N182 Chronic kidney disease, stage 2 (mild): Secondary | ICD-10-CM | POA: Diagnosis not present

## 2020-12-01 DIAGNOSIS — L97509 Non-pressure chronic ulcer of other part of unspecified foot with unspecified severity: Secondary | ICD-10-CM | POA: Diagnosis not present

## 2020-12-08 DIAGNOSIS — L97321 Non-pressure chronic ulcer of left ankle limited to breakdown of skin: Secondary | ICD-10-CM | POA: Diagnosis not present

## 2020-12-19 DIAGNOSIS — Z794 Long term (current) use of insulin: Secondary | ICD-10-CM | POA: Diagnosis not present

## 2020-12-19 DIAGNOSIS — I10 Essential (primary) hypertension: Secondary | ICD-10-CM | POA: Diagnosis not present

## 2020-12-19 DIAGNOSIS — E1165 Type 2 diabetes mellitus with hyperglycemia: Secondary | ICD-10-CM | POA: Diagnosis not present

## 2021-01-18 DIAGNOSIS — Z794 Long term (current) use of insulin: Secondary | ICD-10-CM | POA: Diagnosis not present

## 2021-01-18 DIAGNOSIS — E1165 Type 2 diabetes mellitus with hyperglycemia: Secondary | ICD-10-CM | POA: Diagnosis not present

## 2021-01-18 DIAGNOSIS — I10 Essential (primary) hypertension: Secondary | ICD-10-CM | POA: Diagnosis not present

## 2021-02-01 DIAGNOSIS — L97521 Non-pressure chronic ulcer of other part of left foot limited to breakdown of skin: Secondary | ICD-10-CM | POA: Diagnosis not present

## 2021-02-18 DIAGNOSIS — I10 Essential (primary) hypertension: Secondary | ICD-10-CM | POA: Diagnosis not present

## 2021-02-18 DIAGNOSIS — Z794 Long term (current) use of insulin: Secondary | ICD-10-CM | POA: Diagnosis not present

## 2021-02-18 DIAGNOSIS — E1165 Type 2 diabetes mellitus with hyperglycemia: Secondary | ICD-10-CM | POA: Diagnosis not present

## 2021-02-23 DIAGNOSIS — E113512 Type 2 diabetes mellitus with proliferative diabetic retinopathy with macular edema, left eye: Secondary | ICD-10-CM | POA: Diagnosis not present

## 2021-02-23 DIAGNOSIS — H31093 Other chorioretinal scars, bilateral: Secondary | ICD-10-CM | POA: Diagnosis not present

## 2021-02-23 DIAGNOSIS — E113591 Type 2 diabetes mellitus with proliferative diabetic retinopathy without macular edema, right eye: Secondary | ICD-10-CM | POA: Diagnosis not present

## 2021-02-25 DIAGNOSIS — L97521 Non-pressure chronic ulcer of other part of left foot limited to breakdown of skin: Secondary | ICD-10-CM | POA: Diagnosis not present

## 2021-03-01 DIAGNOSIS — E11610 Type 2 diabetes mellitus with diabetic neuropathic arthropathy: Secondary | ICD-10-CM | POA: Diagnosis not present

## 2021-03-05 DIAGNOSIS — E1122 Type 2 diabetes mellitus with diabetic chronic kidney disease: Secondary | ICD-10-CM | POA: Diagnosis not present

## 2021-03-05 DIAGNOSIS — E1169 Type 2 diabetes mellitus with other specified complication: Secondary | ICD-10-CM | POA: Diagnosis not present

## 2021-03-05 DIAGNOSIS — I10 Essential (primary) hypertension: Secondary | ICD-10-CM | POA: Diagnosis not present

## 2021-03-05 DIAGNOSIS — E11610 Type 2 diabetes mellitus with diabetic neuropathic arthropathy: Secondary | ICD-10-CM | POA: Diagnosis not present

## 2021-03-21 DIAGNOSIS — E1165 Type 2 diabetes mellitus with hyperglycemia: Secondary | ICD-10-CM | POA: Diagnosis not present

## 2021-03-21 DIAGNOSIS — I10 Essential (primary) hypertension: Secondary | ICD-10-CM | POA: Diagnosis not present

## 2021-03-21 DIAGNOSIS — Z794 Long term (current) use of insulin: Secondary | ICD-10-CM | POA: Diagnosis not present

## 2021-03-25 DIAGNOSIS — I10 Essential (primary) hypertension: Secondary | ICD-10-CM | POA: Diagnosis not present

## 2021-03-25 DIAGNOSIS — Z0001 Encounter for general adult medical examination with abnormal findings: Secondary | ICD-10-CM | POA: Diagnosis not present

## 2021-03-25 DIAGNOSIS — M129 Arthropathy, unspecified: Secondary | ICD-10-CM | POA: Diagnosis not present

## 2021-03-25 DIAGNOSIS — N182 Chronic kidney disease, stage 2 (mild): Secondary | ICD-10-CM | POA: Diagnosis not present

## 2021-03-30 DIAGNOSIS — L03119 Cellulitis of unspecified part of limb: Secondary | ICD-10-CM | POA: Diagnosis not present

## 2021-04-20 DIAGNOSIS — L03119 Cellulitis of unspecified part of limb: Secondary | ICD-10-CM | POA: Diagnosis not present

## 2021-04-21 DIAGNOSIS — I10 Essential (primary) hypertension: Secondary | ICD-10-CM | POA: Diagnosis not present

## 2021-04-21 DIAGNOSIS — E1165 Type 2 diabetes mellitus with hyperglycemia: Secondary | ICD-10-CM | POA: Diagnosis not present

## 2021-04-21 DIAGNOSIS — Z794 Long term (current) use of insulin: Secondary | ICD-10-CM | POA: Diagnosis not present

## 2021-05-04 DIAGNOSIS — L84 Corns and callosities: Secondary | ICD-10-CM | POA: Diagnosis not present

## 2021-05-04 DIAGNOSIS — E1142 Type 2 diabetes mellitus with diabetic polyneuropathy: Secondary | ICD-10-CM | POA: Diagnosis not present

## 2021-05-04 DIAGNOSIS — B351 Tinea unguium: Secondary | ICD-10-CM | POA: Diagnosis not present

## 2021-05-04 DIAGNOSIS — M79676 Pain in unspecified toe(s): Secondary | ICD-10-CM | POA: Diagnosis not present

## 2021-06-07 DIAGNOSIS — K719 Toxic liver disease, unspecified: Secondary | ICD-10-CM | POA: Diagnosis not present

## 2021-06-07 DIAGNOSIS — I10 Essential (primary) hypertension: Secondary | ICD-10-CM | POA: Diagnosis not present

## 2021-06-07 DIAGNOSIS — E1122 Type 2 diabetes mellitus with diabetic chronic kidney disease: Secondary | ICD-10-CM | POA: Diagnosis not present

## 2021-06-07 DIAGNOSIS — E1169 Type 2 diabetes mellitus with other specified complication: Secondary | ICD-10-CM | POA: Diagnosis not present

## 2021-06-08 DIAGNOSIS — E113593 Type 2 diabetes mellitus with proliferative diabetic retinopathy without macular edema, bilateral: Secondary | ICD-10-CM | POA: Diagnosis not present

## 2021-06-08 DIAGNOSIS — H31093 Other chorioretinal scars, bilateral: Secondary | ICD-10-CM | POA: Diagnosis not present

## 2021-06-21 DIAGNOSIS — Z794 Long term (current) use of insulin: Secondary | ICD-10-CM | POA: Diagnosis not present

## 2021-06-21 DIAGNOSIS — E1165 Type 2 diabetes mellitus with hyperglycemia: Secondary | ICD-10-CM | POA: Diagnosis not present

## 2021-06-21 DIAGNOSIS — I10 Essential (primary) hypertension: Secondary | ICD-10-CM | POA: Diagnosis not present

## 2021-07-13 DIAGNOSIS — L84 Corns and callosities: Secondary | ICD-10-CM | POA: Diagnosis not present

## 2021-07-13 DIAGNOSIS — E1142 Type 2 diabetes mellitus with diabetic polyneuropathy: Secondary | ICD-10-CM | POA: Diagnosis not present

## 2021-07-13 DIAGNOSIS — M79676 Pain in unspecified toe(s): Secondary | ICD-10-CM | POA: Diagnosis not present

## 2021-07-13 DIAGNOSIS — B351 Tinea unguium: Secondary | ICD-10-CM | POA: Diagnosis not present

## 2021-09-15 DIAGNOSIS — I10 Essential (primary) hypertension: Secondary | ICD-10-CM | POA: Diagnosis not present

## 2021-09-15 DIAGNOSIS — K719 Toxic liver disease, unspecified: Secondary | ICD-10-CM | POA: Diagnosis not present

## 2021-09-15 DIAGNOSIS — E1169 Type 2 diabetes mellitus with other specified complication: Secondary | ICD-10-CM | POA: Diagnosis not present

## 2021-09-15 DIAGNOSIS — E11610 Type 2 diabetes mellitus with diabetic neuropathic arthropathy: Secondary | ICD-10-CM | POA: Diagnosis not present

## 2021-09-21 DIAGNOSIS — E113593 Type 2 diabetes mellitus with proliferative diabetic retinopathy without macular edema, bilateral: Secondary | ICD-10-CM | POA: Diagnosis not present

## 2021-09-21 DIAGNOSIS — H31093 Other chorioretinal scars, bilateral: Secondary | ICD-10-CM | POA: Diagnosis not present

## 2021-11-01 DIAGNOSIS — K719 Toxic liver disease, unspecified: Secondary | ICD-10-CM | POA: Diagnosis not present

## 2021-11-01 DIAGNOSIS — E1122 Type 2 diabetes mellitus with diabetic chronic kidney disease: Secondary | ICD-10-CM | POA: Diagnosis not present

## 2021-11-01 DIAGNOSIS — I10 Essential (primary) hypertension: Secondary | ICD-10-CM | POA: Diagnosis not present

## 2021-11-01 DIAGNOSIS — E1169 Type 2 diabetes mellitus with other specified complication: Secondary | ICD-10-CM | POA: Diagnosis not present

## 2021-11-04 IMAGING — DX DG TIBIA/FIBULA 2V*R*
4 series · 4 of 4 positions shown · non-contrast
Comparison: None.

CLINICAL DATA: Right ankle redness and swelling

EXAM:
RIGHT TIBIA AND FIBULA - 2 VIEW

[tibia ap (1 of 2)]
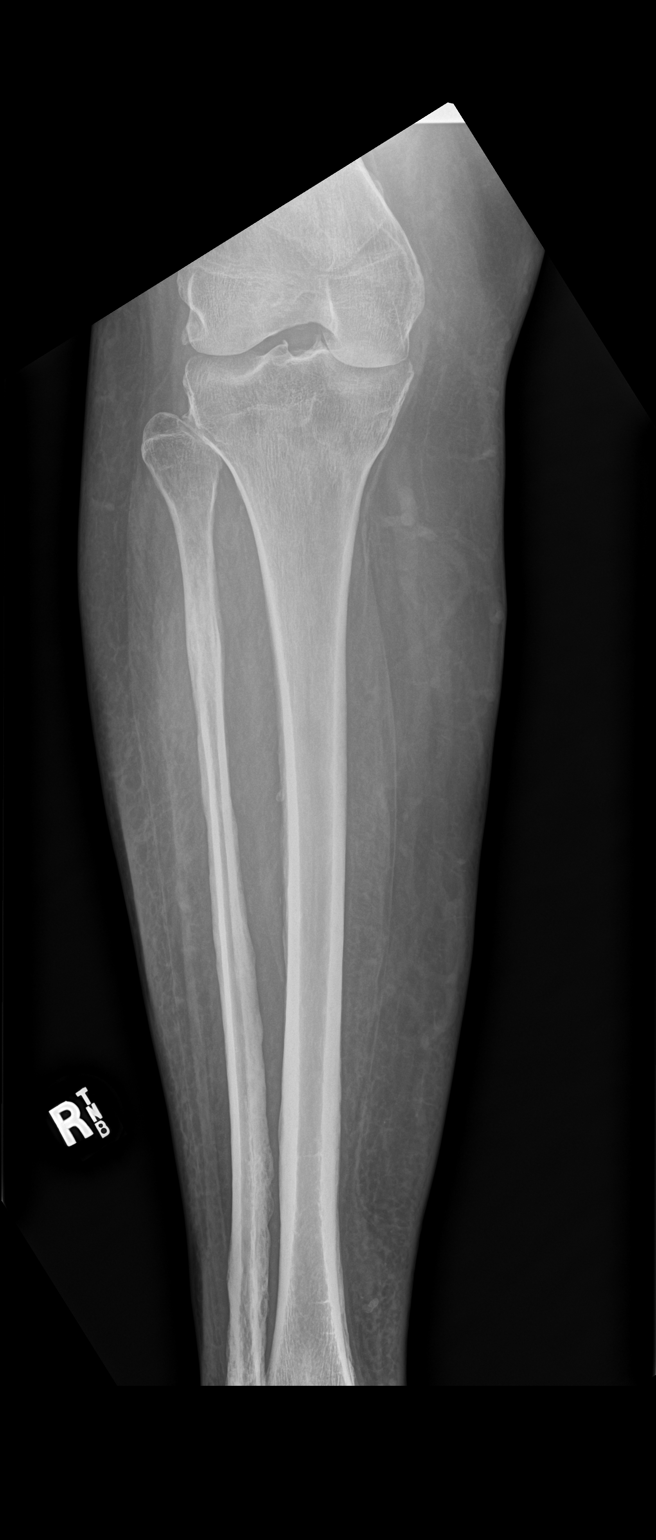

[tibia ap (2 of 2)]
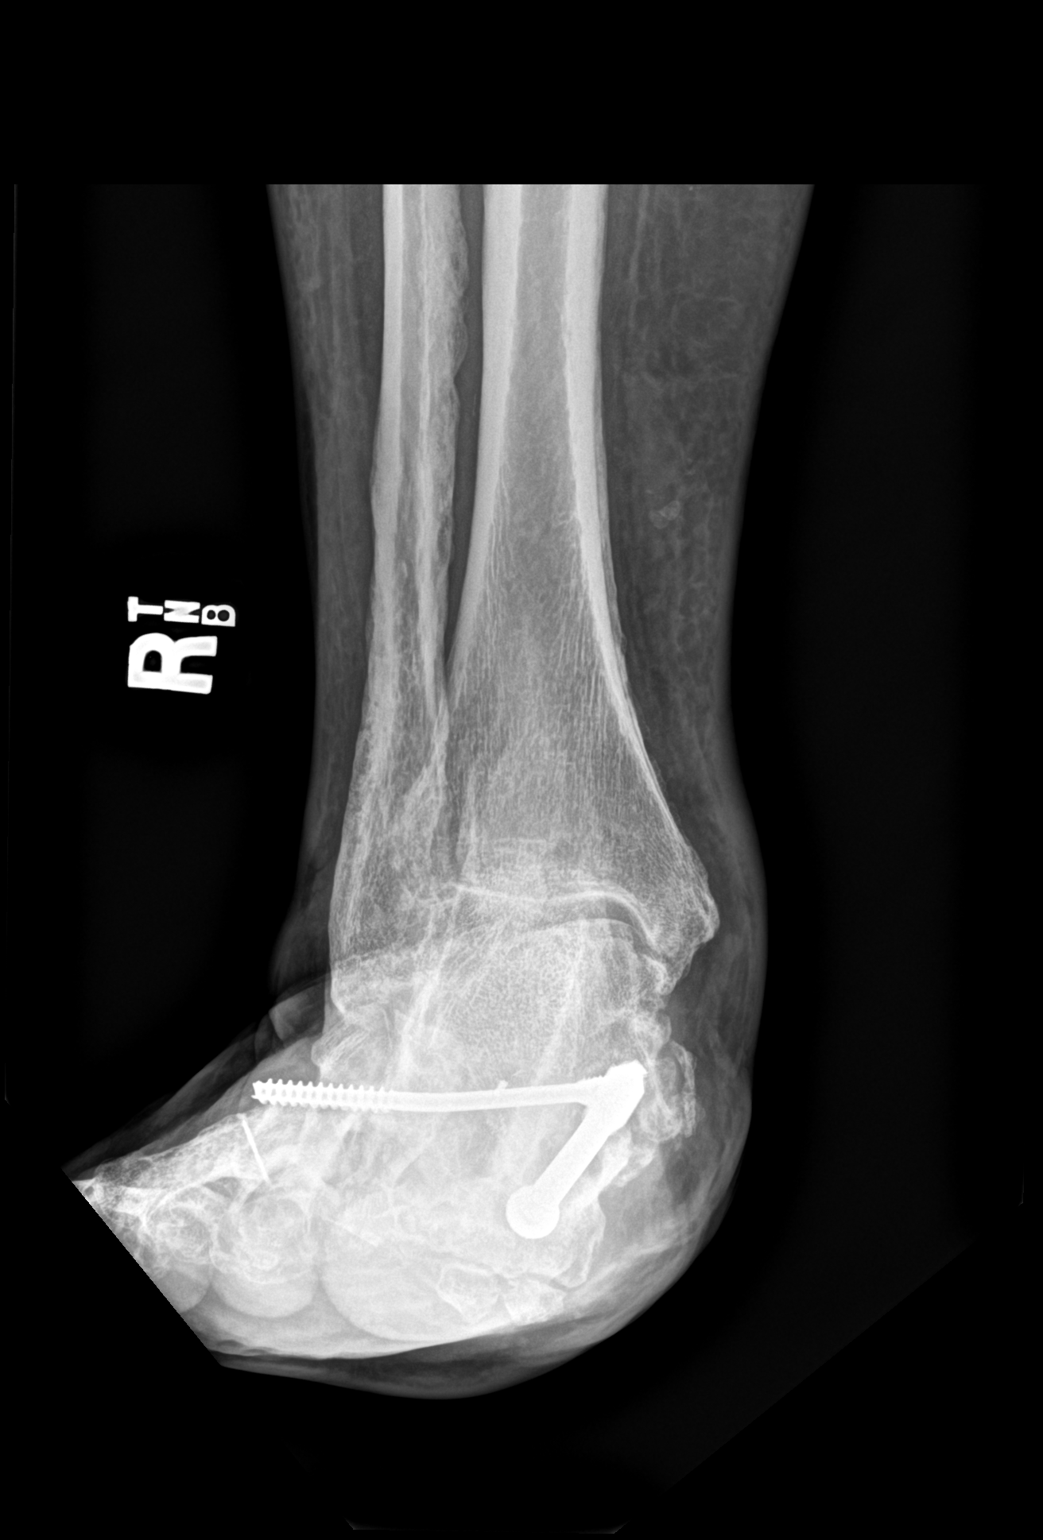

[tibia lat (1 of 2)]
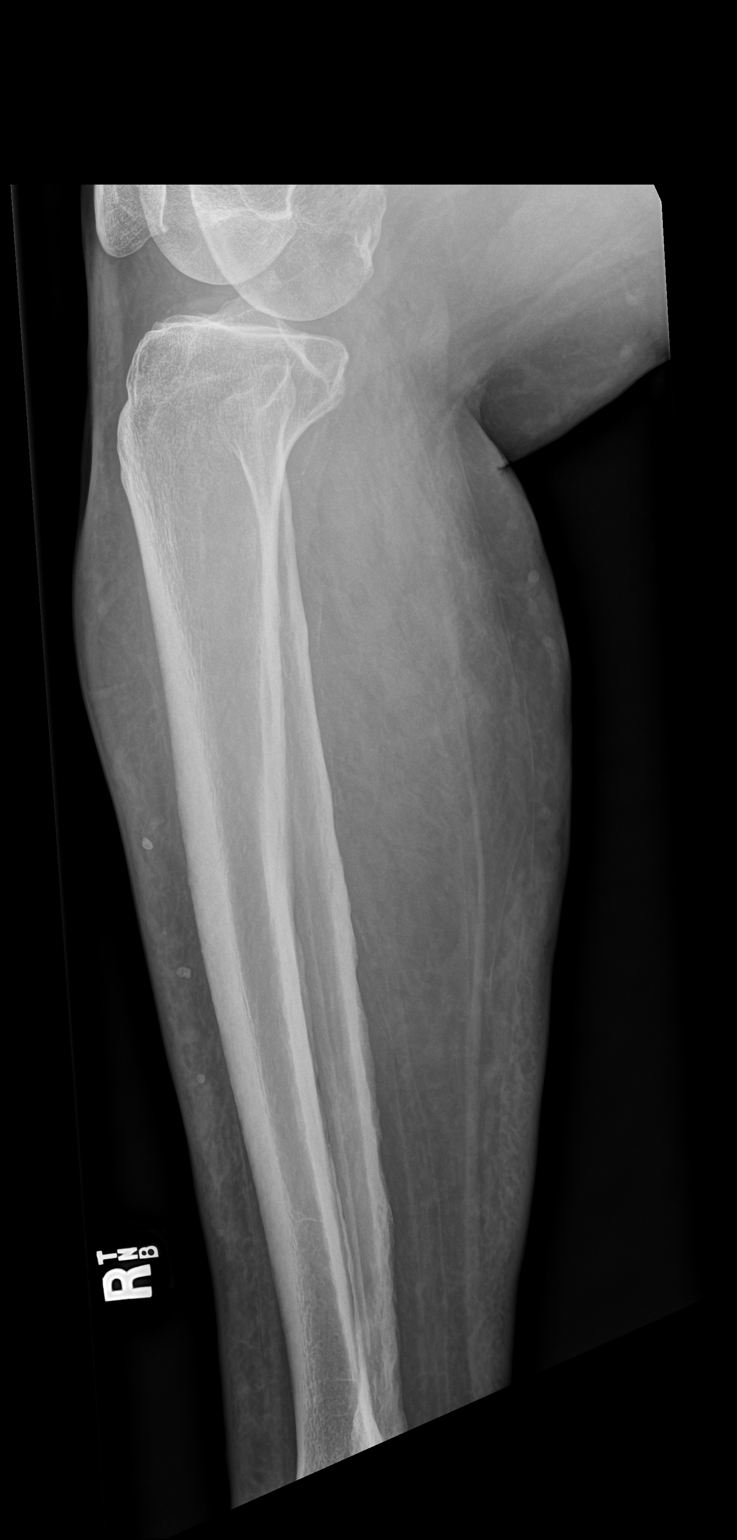

[tibia lat (2 of 2)]
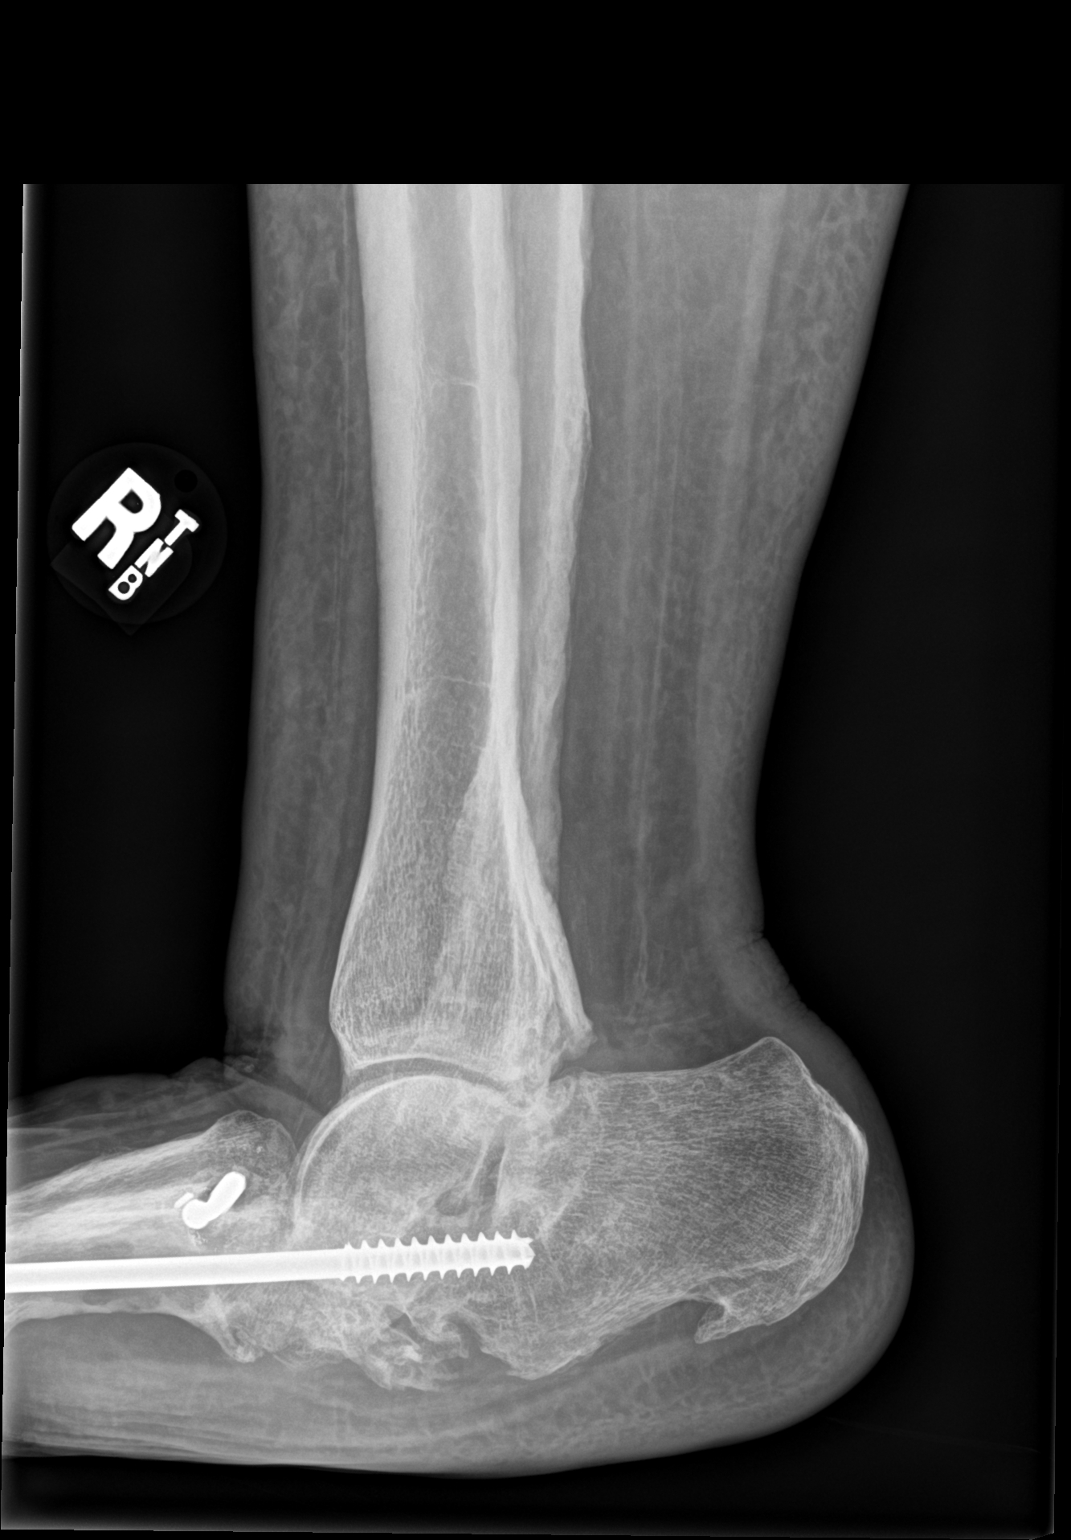

[4 of 4 positions shown; findings below may reference images not displayed]

FINDINGS: Intact right tibia and fibula. Healed fractures at the ankle.
Fixation hardware in the midfoot region with chronic secondary
degenerative changes of the ankle and midfoot. Lucency along the
metallic bone interface may represent component of loosening. No
definite focal soft tissue abnormality.
IMPRESSION: No acute osseous finding or fracture. Chronic postsurgical and
posttraumatic changes of the right ankle and foot. See above
comment.

## 2021-11-11 DIAGNOSIS — E1122 Type 2 diabetes mellitus with diabetic chronic kidney disease: Secondary | ICD-10-CM | POA: Diagnosis not present

## 2021-11-11 DIAGNOSIS — I10 Essential (primary) hypertension: Secondary | ICD-10-CM | POA: Diagnosis not present

## 2021-11-11 DIAGNOSIS — E1169 Type 2 diabetes mellitus with other specified complication: Secondary | ICD-10-CM | POA: Diagnosis not present

## 2021-11-11 DIAGNOSIS — K719 Toxic liver disease, unspecified: Secondary | ICD-10-CM | POA: Diagnosis not present

## 2021-12-28 DIAGNOSIS — I10 Essential (primary) hypertension: Secondary | ICD-10-CM | POA: Diagnosis not present

## 2021-12-28 DIAGNOSIS — E1122 Type 2 diabetes mellitus with diabetic chronic kidney disease: Secondary | ICD-10-CM | POA: Diagnosis not present

## 2021-12-28 DIAGNOSIS — E1169 Type 2 diabetes mellitus with other specified complication: Secondary | ICD-10-CM | POA: Diagnosis not present

## 2021-12-28 DIAGNOSIS — K719 Toxic liver disease, unspecified: Secondary | ICD-10-CM | POA: Diagnosis not present

## 2022-02-16 DIAGNOSIS — E1165 Type 2 diabetes mellitus with hyperglycemia: Secondary | ICD-10-CM | POA: Diagnosis not present

## 2022-02-16 DIAGNOSIS — E1169 Type 2 diabetes mellitus with other specified complication: Secondary | ICD-10-CM | POA: Diagnosis not present

## 2022-02-16 DIAGNOSIS — K219 Gastro-esophageal reflux disease without esophagitis: Secondary | ICD-10-CM | POA: Diagnosis not present

## 2022-02-16 DIAGNOSIS — I10 Essential (primary) hypertension: Secondary | ICD-10-CM | POA: Diagnosis not present

## 2022-03-17 DIAGNOSIS — K719 Toxic liver disease, unspecified: Secondary | ICD-10-CM | POA: Diagnosis not present

## 2022-03-17 DIAGNOSIS — E1122 Type 2 diabetes mellitus with diabetic chronic kidney disease: Secondary | ICD-10-CM | POA: Diagnosis not present

## 2022-03-17 DIAGNOSIS — I10 Essential (primary) hypertension: Secondary | ICD-10-CM | POA: Diagnosis not present

## 2022-03-17 DIAGNOSIS — E1169 Type 2 diabetes mellitus with other specified complication: Secondary | ICD-10-CM | POA: Diagnosis not present

## 2022-03-22 DIAGNOSIS — E113513 Type 2 diabetes mellitus with proliferative diabetic retinopathy with macular edema, bilateral: Secondary | ICD-10-CM | POA: Diagnosis not present

## 2022-03-22 DIAGNOSIS — H31093 Other chorioretinal scars, bilateral: Secondary | ICD-10-CM | POA: Diagnosis not present

## 2022-03-30 DIAGNOSIS — E1165 Type 2 diabetes mellitus with hyperglycemia: Secondary | ICD-10-CM | POA: Diagnosis not present

## 2022-03-30 DIAGNOSIS — N182 Chronic kidney disease, stage 2 (mild): Secondary | ICD-10-CM | POA: Diagnosis not present

## 2022-04-05 DIAGNOSIS — E1169 Type 2 diabetes mellitus with other specified complication: Secondary | ICD-10-CM | POA: Diagnosis not present

## 2022-04-05 DIAGNOSIS — I10 Essential (primary) hypertension: Secondary | ICD-10-CM | POA: Diagnosis not present

## 2022-04-05 DIAGNOSIS — K719 Toxic liver disease, unspecified: Secondary | ICD-10-CM | POA: Diagnosis not present

## 2022-04-05 DIAGNOSIS — E1122 Type 2 diabetes mellitus with diabetic chronic kidney disease: Secondary | ICD-10-CM | POA: Diagnosis not present

## 2022-06-08 DIAGNOSIS — Z794 Long term (current) use of insulin: Secondary | ICD-10-CM | POA: Diagnosis not present

## 2022-06-08 DIAGNOSIS — E1165 Type 2 diabetes mellitus with hyperglycemia: Secondary | ICD-10-CM | POA: Diagnosis not present

## 2022-06-08 DIAGNOSIS — E11621 Type 2 diabetes mellitus with foot ulcer: Secondary | ICD-10-CM | POA: Diagnosis not present

## 2022-06-08 DIAGNOSIS — E7801 Familial hypercholesterolemia: Secondary | ICD-10-CM | POA: Diagnosis not present

## 2022-06-13 DIAGNOSIS — E1122 Type 2 diabetes mellitus with diabetic chronic kidney disease: Secondary | ICD-10-CM | POA: Diagnosis not present

## 2022-06-13 DIAGNOSIS — E1169 Type 2 diabetes mellitus with other specified complication: Secondary | ICD-10-CM | POA: Diagnosis not present

## 2022-06-13 DIAGNOSIS — E11610 Type 2 diabetes mellitus with diabetic neuropathic arthropathy: Secondary | ICD-10-CM | POA: Diagnosis not present

## 2022-06-13 DIAGNOSIS — I10 Essential (primary) hypertension: Secondary | ICD-10-CM | POA: Diagnosis not present

## 2022-07-12 DIAGNOSIS — B351 Tinea unguium: Secondary | ICD-10-CM | POA: Diagnosis not present

## 2022-07-12 DIAGNOSIS — E1142 Type 2 diabetes mellitus with diabetic polyneuropathy: Secondary | ICD-10-CM | POA: Diagnosis not present

## 2022-07-12 DIAGNOSIS — M79676 Pain in unspecified toe(s): Secondary | ICD-10-CM | POA: Diagnosis not present

## 2022-07-12 DIAGNOSIS — L84 Corns and callosities: Secondary | ICD-10-CM | POA: Diagnosis not present

## 2022-07-23 DIAGNOSIS — R030 Elevated blood-pressure reading, without diagnosis of hypertension: Secondary | ICD-10-CM | POA: Diagnosis not present

## 2022-07-23 DIAGNOSIS — Z6837 Body mass index (BMI) 37.0-37.9, adult: Secondary | ICD-10-CM | POA: Diagnosis not present

## 2022-07-23 DIAGNOSIS — L97509 Non-pressure chronic ulcer of other part of unspecified foot with unspecified severity: Secondary | ICD-10-CM | POA: Diagnosis not present

## 2022-07-25 DIAGNOSIS — L03121 Acute lymphangitis of right axilla: Secondary | ICD-10-CM | POA: Diagnosis not present

## 2022-07-25 DIAGNOSIS — L97411 Non-pressure chronic ulcer of right heel and midfoot limited to breakdown of skin: Secondary | ICD-10-CM | POA: Diagnosis not present

## 2022-07-28 DIAGNOSIS — L03121 Acute lymphangitis of right axilla: Secondary | ICD-10-CM | POA: Diagnosis not present

## 2022-08-09 DIAGNOSIS — L03121 Acute lymphangitis of right axilla: Secondary | ICD-10-CM | POA: Diagnosis not present

## 2022-09-06 DIAGNOSIS — L97411 Non-pressure chronic ulcer of right heel and midfoot limited to breakdown of skin: Secondary | ICD-10-CM | POA: Diagnosis not present

## 2022-09-20 DIAGNOSIS — E113511 Type 2 diabetes mellitus with proliferative diabetic retinopathy with macular edema, right eye: Secondary | ICD-10-CM | POA: Diagnosis not present

## 2022-09-20 DIAGNOSIS — H31003 Unspecified chorioretinal scars, bilateral: Secondary | ICD-10-CM | POA: Diagnosis not present

## 2022-09-20 DIAGNOSIS — E113592 Type 2 diabetes mellitus with proliferative diabetic retinopathy without macular edema, left eye: Secondary | ICD-10-CM | POA: Diagnosis not present

## 2022-09-27 DIAGNOSIS — L97411 Non-pressure chronic ulcer of right heel and midfoot limited to breakdown of skin: Secondary | ICD-10-CM | POA: Diagnosis not present

## 2022-10-05 DIAGNOSIS — N182 Chronic kidney disease, stage 2 (mild): Secondary | ICD-10-CM | POA: Diagnosis not present

## 2022-10-05 DIAGNOSIS — E11610 Type 2 diabetes mellitus with diabetic neuropathic arthropathy: Secondary | ICD-10-CM | POA: Diagnosis not present

## 2022-10-05 DIAGNOSIS — E1165 Type 2 diabetes mellitus with hyperglycemia: Secondary | ICD-10-CM | POA: Diagnosis not present

## 2022-10-05 DIAGNOSIS — E7801 Familial hypercholesterolemia: Secondary | ICD-10-CM | POA: Diagnosis not present

## 2022-10-05 DIAGNOSIS — E785 Hyperlipidemia, unspecified: Secondary | ICD-10-CM | POA: Diagnosis not present

## 2022-10-05 DIAGNOSIS — K719 Toxic liver disease, unspecified: Secondary | ICD-10-CM | POA: Diagnosis not present

## 2022-10-05 DIAGNOSIS — E11622 Type 2 diabetes mellitus with other skin ulcer: Secondary | ICD-10-CM | POA: Diagnosis not present

## 2022-10-06 DIAGNOSIS — E11610 Type 2 diabetes mellitus with diabetic neuropathic arthropathy: Secondary | ICD-10-CM | POA: Diagnosis not present

## 2022-10-06 DIAGNOSIS — E11622 Type 2 diabetes mellitus with other skin ulcer: Secondary | ICD-10-CM | POA: Diagnosis not present

## 2022-10-06 DIAGNOSIS — E1165 Type 2 diabetes mellitus with hyperglycemia: Secondary | ICD-10-CM | POA: Diagnosis not present

## 2022-10-11 DIAGNOSIS — E11610 Type 2 diabetes mellitus with diabetic neuropathic arthropathy: Secondary | ICD-10-CM | POA: Diagnosis not present

## 2022-10-11 DIAGNOSIS — E1122 Type 2 diabetes mellitus with diabetic chronic kidney disease: Secondary | ICD-10-CM | POA: Diagnosis not present

## 2022-10-11 DIAGNOSIS — I10 Essential (primary) hypertension: Secondary | ICD-10-CM | POA: Diagnosis not present

## 2022-10-11 DIAGNOSIS — E1169 Type 2 diabetes mellitus with other specified complication: Secondary | ICD-10-CM | POA: Diagnosis not present

## 2022-10-18 DIAGNOSIS — L97421 Non-pressure chronic ulcer of left heel and midfoot limited to breakdown of skin: Secondary | ICD-10-CM | POA: Diagnosis not present

## 2022-11-08 DIAGNOSIS — L97421 Non-pressure chronic ulcer of left heel and midfoot limited to breakdown of skin: Secondary | ICD-10-CM | POA: Diagnosis not present

## 2022-11-29 DIAGNOSIS — L97511 Non-pressure chronic ulcer of other part of right foot limited to breakdown of skin: Secondary | ICD-10-CM | POA: Diagnosis not present

## 2022-12-27 DIAGNOSIS — L84 Corns and callosities: Secondary | ICD-10-CM | POA: Diagnosis not present

## 2022-12-27 DIAGNOSIS — B351 Tinea unguium: Secondary | ICD-10-CM | POA: Diagnosis not present

## 2022-12-27 DIAGNOSIS — E1142 Type 2 diabetes mellitus with diabetic polyneuropathy: Secondary | ICD-10-CM | POA: Diagnosis not present

## 2022-12-27 DIAGNOSIS — M79676 Pain in unspecified toe(s): Secondary | ICD-10-CM | POA: Diagnosis not present

## 2023-01-04 DIAGNOSIS — E11622 Type 2 diabetes mellitus with other skin ulcer: Secondary | ICD-10-CM | POA: Diagnosis not present

## 2023-01-04 DIAGNOSIS — E1169 Type 2 diabetes mellitus with other specified complication: Secondary | ICD-10-CM | POA: Diagnosis not present

## 2023-01-04 DIAGNOSIS — E875 Hyperkalemia: Secondary | ICD-10-CM | POA: Diagnosis not present

## 2023-01-04 DIAGNOSIS — N182 Chronic kidney disease, stage 2 (mild): Secondary | ICD-10-CM | POA: Diagnosis not present

## 2023-01-11 DIAGNOSIS — I10 Essential (primary) hypertension: Secondary | ICD-10-CM | POA: Diagnosis not present

## 2023-01-11 DIAGNOSIS — M79673 Pain in unspecified foot: Secondary | ICD-10-CM | POA: Diagnosis not present

## 2023-01-11 DIAGNOSIS — E11610 Type 2 diabetes mellitus with diabetic neuropathic arthropathy: Secondary | ICD-10-CM | POA: Diagnosis not present

## 2023-01-11 DIAGNOSIS — Z0001 Encounter for general adult medical examination with abnormal findings: Secondary | ICD-10-CM | POA: Diagnosis not present

## 2023-01-11 DIAGNOSIS — R079 Chest pain, unspecified: Secondary | ICD-10-CM | POA: Diagnosis not present

## 2023-01-11 DIAGNOSIS — E1169 Type 2 diabetes mellitus with other specified complication: Secondary | ICD-10-CM | POA: Diagnosis not present

## 2023-02-14 DIAGNOSIS — L03129 Acute lymphangitis of unspecified part of limb: Secondary | ICD-10-CM | POA: Diagnosis not present

## 2023-02-14 DIAGNOSIS — M79672 Pain in left foot: Secondary | ICD-10-CM | POA: Diagnosis not present

## 2023-03-07 DIAGNOSIS — B351 Tinea unguium: Secondary | ICD-10-CM | POA: Diagnosis not present

## 2023-03-07 DIAGNOSIS — M79673 Pain in unspecified foot: Secondary | ICD-10-CM | POA: Diagnosis not present

## 2023-03-07 DIAGNOSIS — L84 Corns and callosities: Secondary | ICD-10-CM | POA: Diagnosis not present

## 2023-03-07 DIAGNOSIS — E1142 Type 2 diabetes mellitus with diabetic polyneuropathy: Secondary | ICD-10-CM | POA: Diagnosis not present

## 2023-03-22 DIAGNOSIS — E113511 Type 2 diabetes mellitus with proliferative diabetic retinopathy with macular edema, right eye: Secondary | ICD-10-CM | POA: Diagnosis not present

## 2023-03-22 DIAGNOSIS — E113592 Type 2 diabetes mellitus with proliferative diabetic retinopathy without macular edema, left eye: Secondary | ICD-10-CM | POA: Diagnosis not present

## 2023-03-22 DIAGNOSIS — H04123 Dry eye syndrome of bilateral lacrimal glands: Secondary | ICD-10-CM | POA: Diagnosis not present

## 2023-03-22 DIAGNOSIS — H31003 Unspecified chorioretinal scars, bilateral: Secondary | ICD-10-CM | POA: Diagnosis not present

## 2023-04-17 DIAGNOSIS — E7801 Familial hypercholesterolemia: Secondary | ICD-10-CM | POA: Diagnosis not present

## 2023-04-17 DIAGNOSIS — E11621 Type 2 diabetes mellitus with foot ulcer: Secondary | ICD-10-CM | POA: Diagnosis not present

## 2023-04-17 DIAGNOSIS — Z1159 Encounter for screening for other viral diseases: Secondary | ICD-10-CM | POA: Diagnosis not present

## 2023-04-17 DIAGNOSIS — E11610 Type 2 diabetes mellitus with diabetic neuropathic arthropathy: Secondary | ICD-10-CM | POA: Diagnosis not present

## 2023-04-17 DIAGNOSIS — E1165 Type 2 diabetes mellitus with hyperglycemia: Secondary | ICD-10-CM | POA: Diagnosis not present

## 2023-04-17 DIAGNOSIS — E7800 Pure hypercholesterolemia, unspecified: Secondary | ICD-10-CM | POA: Diagnosis not present

## 2023-04-26 DIAGNOSIS — E1169 Type 2 diabetes mellitus with other specified complication: Secondary | ICD-10-CM | POA: Diagnosis not present

## 2023-04-26 DIAGNOSIS — E11610 Type 2 diabetes mellitus with diabetic neuropathic arthropathy: Secondary | ICD-10-CM | POA: Diagnosis not present

## 2023-04-26 DIAGNOSIS — M79673 Pain in unspecified foot: Secondary | ICD-10-CM | POA: Diagnosis not present

## 2023-04-26 DIAGNOSIS — Z23 Encounter for immunization: Secondary | ICD-10-CM | POA: Diagnosis not present

## 2023-04-26 DIAGNOSIS — I10 Essential (primary) hypertension: Secondary | ICD-10-CM | POA: Diagnosis not present

## 2023-05-03 DIAGNOSIS — R112 Nausea with vomiting, unspecified: Secondary | ICD-10-CM | POA: Diagnosis not present

## 2023-05-03 DIAGNOSIS — N1831 Chronic kidney disease, stage 3a: Secondary | ICD-10-CM | POA: Diagnosis not present

## 2023-05-03 DIAGNOSIS — R109 Unspecified abdominal pain: Secondary | ICD-10-CM | POA: Diagnosis not present

## 2023-05-09 DIAGNOSIS — B351 Tinea unguium: Secondary | ICD-10-CM | POA: Diagnosis not present

## 2023-05-09 DIAGNOSIS — L84 Corns and callosities: Secondary | ICD-10-CM | POA: Diagnosis not present

## 2023-05-09 DIAGNOSIS — M79673 Pain in unspecified foot: Secondary | ICD-10-CM | POA: Diagnosis not present

## 2023-05-09 DIAGNOSIS — E1142 Type 2 diabetes mellitus with diabetic polyneuropathy: Secondary | ICD-10-CM | POA: Diagnosis not present

## 2023-07-18 DIAGNOSIS — E1142 Type 2 diabetes mellitus with diabetic polyneuropathy: Secondary | ICD-10-CM | POA: Diagnosis not present

## 2023-07-18 DIAGNOSIS — M79673 Pain in unspecified foot: Secondary | ICD-10-CM | POA: Diagnosis not present

## 2023-07-18 DIAGNOSIS — L84 Corns and callosities: Secondary | ICD-10-CM | POA: Diagnosis not present

## 2023-07-18 DIAGNOSIS — B351 Tinea unguium: Secondary | ICD-10-CM | POA: Diagnosis not present

## 2023-07-25 DIAGNOSIS — E1165 Type 2 diabetes mellitus with hyperglycemia: Secondary | ICD-10-CM | POA: Diagnosis not present

## 2023-07-25 DIAGNOSIS — E7849 Other hyperlipidemia: Secondary | ICD-10-CM | POA: Diagnosis not present

## 2023-07-25 DIAGNOSIS — E7801 Familial hypercholesterolemia: Secondary | ICD-10-CM | POA: Diagnosis not present

## 2023-07-25 DIAGNOSIS — E11610 Type 2 diabetes mellitus with diabetic neuropathic arthropathy: Secondary | ICD-10-CM | POA: Diagnosis not present

## 2023-07-25 DIAGNOSIS — I10 Essential (primary) hypertension: Secondary | ICD-10-CM | POA: Diagnosis not present

## 2023-07-25 DIAGNOSIS — N182 Chronic kidney disease, stage 2 (mild): Secondary | ICD-10-CM | POA: Diagnosis not present

## 2023-07-31 DIAGNOSIS — E1169 Type 2 diabetes mellitus with other specified complication: Secondary | ICD-10-CM | POA: Diagnosis not present

## 2023-07-31 DIAGNOSIS — N1831 Chronic kidney disease, stage 3a: Secondary | ICD-10-CM | POA: Diagnosis not present

## 2023-07-31 DIAGNOSIS — I10 Essential (primary) hypertension: Secondary | ICD-10-CM | POA: Diagnosis not present

## 2023-09-26 DIAGNOSIS — M79673 Pain in unspecified foot: Secondary | ICD-10-CM | POA: Diagnosis not present

## 2023-09-26 DIAGNOSIS — E1142 Type 2 diabetes mellitus with diabetic polyneuropathy: Secondary | ICD-10-CM | POA: Diagnosis not present

## 2023-09-26 DIAGNOSIS — B351 Tinea unguium: Secondary | ICD-10-CM | POA: Diagnosis not present

## 2023-09-26 DIAGNOSIS — L84 Corns and callosities: Secondary | ICD-10-CM | POA: Diagnosis not present

## 2023-11-20 DIAGNOSIS — E1159 Type 2 diabetes mellitus with other circulatory complications: Secondary | ICD-10-CM | POA: Diagnosis not present

## 2023-11-20 DIAGNOSIS — Z794 Long term (current) use of insulin: Secondary | ICD-10-CM | POA: Diagnosis not present

## 2023-11-20 DIAGNOSIS — Z13220 Encounter for screening for lipoid disorders: Secondary | ICD-10-CM | POA: Diagnosis not present

## 2023-11-20 DIAGNOSIS — E11319 Type 2 diabetes mellitus with unspecified diabetic retinopathy without macular edema: Secondary | ICD-10-CM | POA: Diagnosis not present

## 2023-11-30 DIAGNOSIS — N1831 Chronic kidney disease, stage 3a: Secondary | ICD-10-CM | POA: Diagnosis not present

## 2023-11-30 DIAGNOSIS — I739 Peripheral vascular disease, unspecified: Secondary | ICD-10-CM | POA: Diagnosis not present

## 2023-11-30 DIAGNOSIS — K219 Gastro-esophageal reflux disease without esophagitis: Secondary | ICD-10-CM | POA: Diagnosis not present

## 2023-11-30 DIAGNOSIS — E1169 Type 2 diabetes mellitus with other specified complication: Secondary | ICD-10-CM | POA: Diagnosis not present

## 2023-12-05 DIAGNOSIS — E1142 Type 2 diabetes mellitus with diabetic polyneuropathy: Secondary | ICD-10-CM | POA: Diagnosis not present

## 2023-12-05 DIAGNOSIS — L84 Corns and callosities: Secondary | ICD-10-CM | POA: Diagnosis not present

## 2023-12-05 DIAGNOSIS — M79675 Pain in left toe(s): Secondary | ICD-10-CM | POA: Diagnosis not present

## 2023-12-05 DIAGNOSIS — M79674 Pain in right toe(s): Secondary | ICD-10-CM | POA: Diagnosis not present

## 2023-12-05 DIAGNOSIS — B351 Tinea unguium: Secondary | ICD-10-CM | POA: Diagnosis not present

## 2024-02-13 DIAGNOSIS — M19071 Primary osteoarthritis, right ankle and foot: Secondary | ICD-10-CM | POA: Diagnosis not present

## 2024-02-13 DIAGNOSIS — M79671 Pain in right foot: Secondary | ICD-10-CM | POA: Diagnosis not present

## 2024-02-13 DIAGNOSIS — R601 Generalized edema: Secondary | ICD-10-CM | POA: Diagnosis not present

## 2024-02-13 DIAGNOSIS — E1142 Type 2 diabetes mellitus with diabetic polyneuropathy: Secondary | ICD-10-CM | POA: Diagnosis not present

## 2024-03-20 DIAGNOSIS — I10 Essential (primary) hypertension: Secondary | ICD-10-CM | POA: Diagnosis not present

## 2024-03-20 DIAGNOSIS — E1159 Type 2 diabetes mellitus with other circulatory complications: Secondary | ICD-10-CM | POA: Diagnosis not present

## 2024-03-20 DIAGNOSIS — E11319 Type 2 diabetes mellitus with unspecified diabetic retinopathy without macular edema: Secondary | ICD-10-CM | POA: Diagnosis not present

## 2024-03-20 DIAGNOSIS — E1169 Type 2 diabetes mellitus with other specified complication: Secondary | ICD-10-CM | POA: Diagnosis not present

## 2024-03-20 DIAGNOSIS — E785 Hyperlipidemia, unspecified: Secondary | ICD-10-CM | POA: Diagnosis not present

## 2024-03-20 DIAGNOSIS — Z1329 Encounter for screening for other suspected endocrine disorder: Secondary | ICD-10-CM | POA: Diagnosis not present

## 2024-03-20 DIAGNOSIS — N1831 Chronic kidney disease, stage 3a: Secondary | ICD-10-CM | POA: Diagnosis not present

## 2024-03-20 DIAGNOSIS — E1165 Type 2 diabetes mellitus with hyperglycemia: Secondary | ICD-10-CM | POA: Diagnosis not present

## 2024-03-25 DIAGNOSIS — I739 Peripheral vascular disease, unspecified: Secondary | ICD-10-CM | POA: Diagnosis not present

## 2024-03-25 DIAGNOSIS — Z0001 Encounter for general adult medical examination with abnormal findings: Secondary | ICD-10-CM | POA: Diagnosis not present

## 2024-03-25 DIAGNOSIS — Z1389 Encounter for screening for other disorder: Secondary | ICD-10-CM | POA: Diagnosis not present

## 2024-03-25 DIAGNOSIS — Z1331 Encounter for screening for depression: Secondary | ICD-10-CM | POA: Diagnosis not present

## 2024-03-25 DIAGNOSIS — Z9189 Other specified personal risk factors, not elsewhere classified: Secondary | ICD-10-CM | POA: Diagnosis not present

## 2024-03-25 DIAGNOSIS — Z6835 Body mass index (BMI) 35.0-35.9, adult: Secondary | ICD-10-CM | POA: Diagnosis not present

## 2024-03-25 DIAGNOSIS — N1831 Chronic kidney disease, stage 3a: Secondary | ICD-10-CM | POA: Diagnosis not present

## 2024-03-25 DIAGNOSIS — E1169 Type 2 diabetes mellitus with other specified complication: Secondary | ICD-10-CM | POA: Diagnosis not present

## 2024-03-25 DIAGNOSIS — K219 Gastro-esophageal reflux disease without esophagitis: Secondary | ICD-10-CM | POA: Diagnosis not present

## 2024-03-27 DIAGNOSIS — E113511 Type 2 diabetes mellitus with proliferative diabetic retinopathy with macular edema, right eye: Secondary | ICD-10-CM | POA: Diagnosis not present

## 2024-03-27 DIAGNOSIS — H04123 Dry eye syndrome of bilateral lacrimal glands: Secondary | ICD-10-CM | POA: Diagnosis not present

## 2024-03-27 DIAGNOSIS — H31003 Unspecified chorioretinal scars, bilateral: Secondary | ICD-10-CM | POA: Diagnosis not present

## 2024-03-27 DIAGNOSIS — E113592 Type 2 diabetes mellitus with proliferative diabetic retinopathy without macular edema, left eye: Secondary | ICD-10-CM | POA: Diagnosis not present

## 2024-04-23 DIAGNOSIS — L84 Corns and callosities: Secondary | ICD-10-CM | POA: Diagnosis not present

## 2024-04-23 DIAGNOSIS — E1142 Type 2 diabetes mellitus with diabetic polyneuropathy: Secondary | ICD-10-CM | POA: Diagnosis not present

## 2024-04-23 DIAGNOSIS — B351 Tinea unguium: Secondary | ICD-10-CM | POA: Diagnosis not present

## 2024-07-02 DIAGNOSIS — B351 Tinea unguium: Secondary | ICD-10-CM | POA: Diagnosis not present

## 2024-07-02 DIAGNOSIS — L84 Corns and callosities: Secondary | ICD-10-CM | POA: Diagnosis not present

## 2024-07-02 DIAGNOSIS — E1142 Type 2 diabetes mellitus with diabetic polyneuropathy: Secondary | ICD-10-CM | POA: Diagnosis not present

## 2024-07-02 DIAGNOSIS — M79675 Pain in left toe(s): Secondary | ICD-10-CM | POA: Diagnosis not present

## 2024-07-02 DIAGNOSIS — M79674 Pain in right toe(s): Secondary | ICD-10-CM | POA: Diagnosis not present

## 2024-07-19 DIAGNOSIS — E785 Hyperlipidemia, unspecified: Secondary | ICD-10-CM | POA: Diagnosis not present

## 2024-07-19 DIAGNOSIS — Z1329 Encounter for screening for other suspected endocrine disorder: Secondary | ICD-10-CM | POA: Diagnosis not present

## 2024-07-19 DIAGNOSIS — D649 Anemia, unspecified: Secondary | ICD-10-CM | POA: Diagnosis not present

## 2024-07-19 DIAGNOSIS — N1831 Chronic kidney disease, stage 3a: Secondary | ICD-10-CM | POA: Diagnosis not present

## 2024-07-19 DIAGNOSIS — E1165 Type 2 diabetes mellitus with hyperglycemia: Secondary | ICD-10-CM | POA: Diagnosis not present

## 2024-07-25 DIAGNOSIS — K219 Gastro-esophageal reflux disease without esophagitis: Secondary | ICD-10-CM | POA: Diagnosis not present

## 2024-07-25 DIAGNOSIS — N1831 Chronic kidney disease, stage 3a: Secondary | ICD-10-CM | POA: Diagnosis not present

## 2024-07-25 DIAGNOSIS — E1169 Type 2 diabetes mellitus with other specified complication: Secondary | ICD-10-CM | POA: Diagnosis not present

## 2024-07-25 DIAGNOSIS — I739 Peripheral vascular disease, unspecified: Secondary | ICD-10-CM | POA: Diagnosis not present
